# Patient Record
Sex: Female | Born: 2000 | Race: White | Hispanic: No | Marital: Single | Smoking: Never smoker
Health system: Southern US, Community
[De-identification: ages and names within clinical notes are randomized; demographics above are authoritative.]

## PROBLEM LIST (undated history)

## (undated) DIAGNOSIS — F32A Depression, unspecified: Secondary | ICD-10-CM

## (undated) DIAGNOSIS — F419 Anxiety disorder, unspecified: Secondary | ICD-10-CM

## (undated) DIAGNOSIS — R569 Unspecified convulsions: Secondary | ICD-10-CM

## (undated) DIAGNOSIS — G43909 Migraine, unspecified, not intractable, without status migrainosus: Secondary | ICD-10-CM

## (undated) HISTORY — DX: Migraine, unspecified, not intractable, without status migrainosus: G43.909

## (undated) HISTORY — PX: TYMPANOSTOMY TUBE PLACEMENT: SHX32

## (undated) HISTORY — PX: TONSILLECTOMY: SUR1361

---
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
## 2022-02-07 ENCOUNTER — Encounter: Payer: Self-pay | Admitting: Emergency Medicine

## 2022-02-07 ENCOUNTER — Ambulatory Visit
Admission: EM | Admit: 2022-02-07 | Discharge: 2022-02-07 | Disposition: A | Discharge status: Home / Self Care | Payer: BC Managed Care – PPO

## 2022-02-07 ENCOUNTER — Other Ambulatory Visit: Payer: Self-pay

## 2022-02-07 DIAGNOSIS — R519 Headache, unspecified: Secondary | ICD-10-CM | POA: Diagnosis not present

## 2022-02-07 MED ORDER — KETOROLAC TROMETHAMINE 30 MG/ML IJ SOLN
30.0000 mg | Freq: Once | INTRAMUSCULAR | Status: AC
Start: 2022-02-07 — End: 2022-02-07
  Administered 2022-02-07: 30 mg via INTRAMUSCULAR

## 2022-02-07 MED ORDER — ONDANSETRON 4 MG PO TBDP
4.0000 mg | ORAL_TABLET | Freq: Once | ORAL | Status: AC
  Administered 2022-02-07: 4 mg via ORAL

## 2022-02-07 MED ORDER — SUMATRIPTAN SUCCINATE 6 MG/0.5ML ~~LOC~~ SOLN: 6.0000 mg | Freq: Once | SUBCUTANEOUS | Status: DC

## 2022-02-07 MED ORDER — DEXAMETHASONE SODIUM PHOSPHATE 10 MG/ML IJ SOLN
10.0000 mg | Freq: Once | INTRAMUSCULAR | Status: AC
  Administered 2022-02-07: 10 mg via INTRAMUSCULAR

## 2022-02-07 NOTE — ED Provider Notes (Signed)
Casey Miller    CSN: [id number] Arrival date & time: 02/07/22  1249      History   Chief Complaint Chief Complaint  Patient presents with   Headache    HPI Casey Miller is a 21 y.o. female.   Patient presents with a persistent frontal headache nausea without vomiting and body aches for 7 days, endorses fever beginning today.  She had photophobia, phonophobia and floaters.  endorses that symptoms worsen over the last 2 days.  Decreased appetite but tolerating some food and liquids.  No known sick contacts.  Has attempted use of an over-the-counter analgesic which has been ineffective.  Denies ear pain, congestion, sore throat, coughing, shortness of breath or wheezing.  History of cluster headaches/migraines.   Past Medical History:  Diagnosis Date   Depression    Migraine     Patient Active Problem List   Diagnosis Date Noted   ALLERGIC RHINITIS 05/14/2010    Past Surgical History:  Procedure Laterality Date   TONSILLECTOMY     TYMPANOSTOMY TUBE PLACEMENT      OB History     Gravida  0   Para  0   Term  0   Preterm  0   AB  0   Living  0      SAB  0   IAB  0   Ectopic  0   Multiple  0   Live Births  0            Home Medications    Prior to Admission medications   Medication Sig Start Date End Date Taking? Authorizing Provider  acetaminophen (TYLENOL) 325 MG tablet Take 650 mg by mouth every 6 (six) hours as needed.   Yes [provider]  benzonatate (TESSALON) 100 MG capsule Take 2 capsules (200 mg total) by mouth every 8 (eight) hours. Patient not taking: Reported on 02/07/2022 04/12/21   Margarette Canada, NP  ipratropium (ATROVENT) 0.06 % nasal spray Place 2 sprays into both nostrils 4 (four) times daily. Patient not taking: Reported on 02/07/2022 04/12/21   Margarette Canada, NP  metroNIDAZOLE (FLAGYL) 500 MG tablet Take 1 tablet (500 mg total) by mouth 2 (two) times daily. Patient not taking: Reported on 02/07/2022 12/07/21    Chase Picket, MD  naproxen (NAPROSYN) 375 MG tablet Take 1 tablet (375 mg total) by mouth 2 (two) times daily as needed for moderate pain. Patient not taking: Reported on 02/07/2022 11/28/20   Coral Spikes, DO  promethazine-dextromethorphan (PROMETHAZINE-DM) 6.25-15 MG/5ML syrup Take 5 mLs by mouth 4 (four) times daily as needed. Patient not taking: Reported on 02/07/2022 04/12/21   Margarette Canada, NP  mirtazapine (REMERON) 7.5 MG tablet Take 7.5 mg by mouth at bedtime. Patient not taking: No sig reported 04/19/20 11/28/20  [provider]    Family History Family History  Problem Relation Age of Onset   Cancer Paternal Grandmother    Cancer Maternal Grandmother    Hypertension Father    Migraines Father    Cancer Father    Migraines Mother    Mental illness Sister     Social History Social History   Tobacco Use   Smoking status: Never    Passive exposure: Yes   Smokeless tobacco: Never   Tobacco comments:    mother smokes outside  Vaping Use   Vaping Use: Some days  Substance Use Topics   Alcohol use: No   Drug use: No  Allergies   Amoxicillin   Review of Systems Review of Systems Defer to HPI   Physical Exam Triage Vital Signs ED Triage Vitals  Enc Vitals Group     BP 02/07/22 1315 108/71     Pulse Rate 02/07/22 1315 (!) 104     Resp 02/07/22 1315 16     Temp 02/07/22 1315 99 F (37.2 C)     Temp Source 02/07/22 1315 Oral     SpO2 02/07/22 1315 97 %     Weight --      Height --      Head Circumference --      Peak Flow --      Pain Score 02/07/22 1312 7     Pain Loc --      Pain Edu? --      Excl. in [place]? --    No data found.  Updated Vital Signs BP 108/71 (BP Location: Left Arm)   Pulse (!) 104   Temp 99 F (37.2 C) (Oral)   Resp 16   SpO2 97%   Visual Acuity Right Eye Distance:   Left Eye Distance:   Bilateral Distance:    Right Eye Near:   Left Eye Near:    Bilateral Near:     Physical Exam Constitutional:       Appearance: Normal appearance. She is well-developed.  HENT:     Head: Normocephalic.  Eyes:     Extraocular Movements: Extraocular movements intact.  Pulmonary:     Effort: Pulmonary effort is normal.  Neurological:     General: No focal deficit present.     Mental Status: She is alert and oriented to person, place, and time. Mental status is at baseline.     Cranial Nerves: No cranial nerve deficit.     Sensory: No sensory deficit.     Motor: No weakness.     Coordination: Coordination normal.     Gait: Gait normal.  Psychiatric:        Mood and Affect: Mood normal.        Behavior: Behavior normal.      UC Treatments / Results  Labs (all labs ordered are listed, but only abnormal results are displayed) Labs Reviewed  COVID-19, FLU A+B NAA    EKG   Radiology No results found.  Procedures Procedures (including critical care time)  Medications Ordered in UC Medications  ketorolac (TORADOL) 30 MG/ML injection 30 mg (30 mg Intramuscular Given 02/07/22 1357)  ondansetron (ZOFRAN-ODT) disintegrating tablet 4 mg (4 mg Oral Given 02/07/22 1357)  dexamethasone (DECADRON) injection 10 mg (10 mg Intramuscular Given 02/07/22 1357)    Initial Impression / Assessment and Plan / UC Course  I have reviewed the triage vital signs and the nursing notes.  Pertinent labs & imaging results that were available during my care of the patient were reviewed by me and considered in my medical decision making (see chart for details).  Bad headache  Etiology of symptoms are possibly all related to persistent headache.  But as patient has had fever symptoms could be a part of a viral process, discussed with patient, COVID test is pending, per CDC has met quarantine guidelines and as a young healthy adult does not qualify for antiviral treatment, Toradol, Decadron injection given in office as well as ODT Zofran, recommended continued use of over-the-counter analgesics, low stimulation activities,  adequate rest and increase fluid intake for supportive measures, may follow-up with urgent care as needed if symptoms persist  or worsen, work note given Final Clinical Impressions(s) / UC Diagnoses   Final diagnoses:  Bad headache     Discharge Instructions      Today you are being treated for a bad headache however as she was having fever we will complete COVID testing presents, may be related to a viral process which should improve with time  You have been given an injection of Decadron and Toradol to help reduce the pain and pressure associated with headache and you have been given nausea medicine to help calm this as well  At home you may take Tylenol 500 to 1000 mg every 6 hours and/or ibuprofen 800 mg every 8 hours for management of headache  You may use Zofran every 8 hours as needed to help manage nausea  Increase your fluid intake until your appetite returns to maintain your hydration  Headache is present participate in low stimulation activities, avoiding bright lights and loud noises  May follow-up with this urgent care or your primary doctor as needed for reevaluation   ED Prescriptions   None    PDMP not reviewed this encounter.   Hans Eden, NP 02/07/22 1601

## 2022-02-07 NOTE — ED Triage Notes (Signed)
Started feeling bad one week ago.  Now has nausea, body aches, headache

## 2022-02-07 NOTE — Discharge Instructions (Signed)
Today you are being treated for a bad headache however as she was having fever we will complete COVID testing presents, may be related to a viral process which should improve with time  You have been given an injection of Decadron and Toradol to help reduce the pain and pressure associated with headache and you have been given nausea medicine to help calm this as well  At home you may take Tylenol 500 to 1000 mg every 6 hours and/or ibuprofen 800 mg every 8 hours for management of headache  You may use Zofran every 8 hours as needed to help manage nausea  Increase your fluid intake until your appetite returns to maintain your hydration  Headache is present participate in low stimulation activities, avoiding bright lights and loud noises  May follow-up with this urgent care or your primary doctor as needed for reevaluation

## 2022-02-08 LAB — COVID-19, FLU A+B NAA
Influenza A, NAA: NOT DETECTED
Influenza B, NAA: NOT DETECTED
SARS-CoV-2, NAA: NOT DETECTED

## 2022-07-19 ENCOUNTER — Encounter: Payer: Self-pay | Admitting: Emergency Medicine

## 2022-07-19 DIAGNOSIS — M2392 Unspecified internal derangement of left knee: Secondary | ICD-10-CM | POA: Diagnosis not present

## 2022-07-19 DIAGNOSIS — M25562 Pain in left knee: Secondary | ICD-10-CM | POA: Diagnosis present

## 2022-07-19 NOTE — ED Triage Notes (Signed)
Pt presents via POV with complaints of L knee pain that started tonight. Pt was using the leg press at the gym and felt a "pop" in her left knee. Rates the pain 10/10 - no deformities noted. Pt took 800mg  of motrin PTA without improvement in her pain.

## 2022-07-20 ENCOUNTER — Emergency Department: Payer: BC Managed Care – PPO

## 2022-07-20 ENCOUNTER — Emergency Department
Admission: EM | Admit: 2022-07-20 | Discharge: 2022-07-20 | Disposition: A | Discharge status: Home / Self Care | Payer: BC Managed Care – PPO | Attending: Emergency Medicine | Admitting: Emergency Medicine

## 2022-07-20 DIAGNOSIS — M25562 Pain in left knee: Secondary | ICD-10-CM

## 2022-07-20 DIAGNOSIS — M2392 Unspecified internal derangement of left knee: Secondary | ICD-10-CM

## 2022-07-20 MED ORDER — HYDROCODONE-ACETAMINOPHEN 5-325 MG PO TABS: 1.0000 | ORAL_TABLET | Freq: Four times a day (QID) | ORAL | 0 refills | Status: DC | PRN

## 2022-07-20 MED ORDER — HYDROCODONE-ACETAMINOPHEN 5-325 MG PO TABS
1.0000 | ORAL_TABLET | Freq: Once | ORAL | Status: AC
  Administered 2022-07-20: 1 via ORAL
  Filled 2022-07-20: qty 1

## 2022-07-20 MED ORDER — IBUPROFEN 600 MG PO TABS: 600.0000 mg | ORAL_TABLET | Freq: Three times a day (TID) | ORAL | 0 refills | Status: DC | PRN

## 2022-07-20 NOTE — ED Provider Notes (Signed)
[facility] Provider Note    Event Date/Time   First MD Initiated Contact with Patient 07/20/22 0208     (approximate)   History   Left knee pain   HPI  Casey Miller is a 22 y.o. female  who presents to the ED from home with a chief complaint of left knee pain.  Patient was using the leg press at the gym around 190 pounds when she felt a "pop" in her left knee.  Took ibuprofen prior to arrival.  Hurts to ambulate.  Voices no other complaints or injuries.       Past Medical History   Past Medical History:  Diagnosis Date   Depression    Migraine      Active Problem List   Patient Active Problem List   Diagnosis Date Noted   ALLERGIC RHINITIS 05/14/2010     Past Surgical History   Past Surgical History:  Procedure Laterality Date   TONSILLECTOMY     TYMPANOSTOMY TUBE PLACEMENT       Home Medications   Prior to Admission medications   Medication Sig Start Date End Date Taking? Authorizing Provider  HYDROcodone-acetaminophen (NORCO) 5-325 MG tablet Take 1 tablet by mouth every 6 (six) hours as needed for moderate pain. 07/20/22  Yes Paulette Blanch, MD  ibuprofen (ADVIL) 600 MG tablet Take 1 tablet (600 mg total) by mouth every 8 (eight) hours as needed. 07/20/22  Yes Paulette Blanch, MD  acetaminophen (TYLENOL) 325 MG tablet Take 650 mg by mouth every 6 (six) hours as needed.    [provider]  benzonatate (TESSALON) 100 MG capsule Take 2 capsules (200 mg total) by mouth every 8 (eight) hours. Patient not taking: Reported on 02/07/2022 04/12/21   Margarette Canada, NP  ipratropium (ATROVENT) 0.06 % nasal spray Place 2 sprays into both nostrils 4 (four) times daily. Patient not taking: Reported on 02/07/2022 04/12/21   Margarette Canada, NP  metroNIDAZOLE (FLAGYL) 500 MG tablet Take 1 tablet (500 mg total) by mouth 2 (two) times daily. Patient not taking: Reported on 02/07/2022 12/07/21   Chase Picket, MD  naproxen (NAPROSYN) 375 MG tablet Take  1 tablet (375 mg total) by mouth 2 (two) times daily as needed for moderate pain. Patient not taking: Reported on 02/07/2022 11/28/20   Coral Spikes, DO  promethazine-dextromethorphan (PROMETHAZINE-DM) 6.25-15 MG/5ML syrup Take 5 mLs by mouth 4 (four) times daily as needed. Patient not taking: Reported on 02/07/2022 04/12/21   Margarette Canada, NP  mirtazapine (REMERON) 7.5 MG tablet Take 7.5 mg by mouth at bedtime. Patient not taking: No sig reported 04/19/20 11/28/20  [provider]     Allergies  Amoxicillin   Family History   Family History  Problem Relation Age of Onset   Cancer Paternal Grandmother    Cancer Maternal Grandmother    Hypertension Father    Migraines Father    Cancer Father    Migraines Mother    Mental illness Sister      Physical Exam  Triage Vital Signs: ED Triage Vitals  Enc Vitals Group     BP 07/19/22 2353 120/88     Pulse Rate 07/19/22 2353 (!) 105     Resp 07/19/22 2353 18     Temp 07/19/22 2353 (!) 97.5 F (36.4 C)     Temp Source 07/19/22 2353 Oral     SpO2 07/19/22 2353 100 %     Weight 07/19/22 2353 120 lb (  54.4 kg)     Height 07/19/22 2353 5\' 7"  (1.702 m)     Head Circumference --      Peak Flow --      Pain Score 07/19/22 2355 10     Pain Loc --      Pain Edu? --      Excl. in [place]? --     Updated Vital Signs: BP 120/88 (BP Location: Left Arm)   Pulse (!) 105   Temp (!) 97.5 F (36.4 C) (Oral)   Resp 18   Ht 5\' 7"  (1.702 m)   Wt 54.4 kg   LMP 07/12/2022 (Approximate)   SpO2 100%   BMI 18.79 kg/m    General: Awake, mild distress.  CV:  Good peripheral perfusion.  Resp:  Normal effort.  Abd:  No distention.  Other:  Left knee: Mild uniform swelling.  Tender to palpation all aspects.  Limited range of motion secondary to pain.  Supple calf without tenderness.  2+ distal pulses.   ED Results / Procedures / Treatments  Labs (all labs ordered are listed, but only abnormal results are displayed) Labs Reviewed - No data  to display   EKG  None   RADIOLOGY I have independently visualized and interpreted patient's x-ray as well as noted the radiology interpretation:  Left knee xray: No acute osseous injury  Official radiology report(s): DG Knee Complete 4 Views Left  Result Date: 07/20/2022 CLINICAL DATA:  Left knee pain after feeling a pop EXAM: LEFT KNEE - COMPLETE 4+ VIEW COMPARISON:  None Available. FINDINGS: No evidence of fracture, dislocation, or joint effusion. No evidence of arthropathy or other focal bone abnormality. Soft tissues are unremarkable. IMPRESSION: Negative. Electronically Signed   By: Placido Sou M.D.   On: 07/20/2022 01:17     PROCEDURES:  Critical Care performed: No  Procedures   MEDICATIONS ORDERED IN ED: Medications  HYDROcodone-acetaminophen (NORCO/VICODIN) 5-325 MG per tablet 1 tablet (has no administration in time range)     IMPRESSION / MDM / ASSESSMENT AND PLAN / ED COURSE  I reviewed the triage vital signs and the nursing notes.                             22 year old female presenting with left knee pain/injury.  X-rays negative for acute osseous injury.  Patient is most comfortable in flexion so do not feel knee immobilizer would work for her.  Will place an Ace wrap, NSAIDs, analgesia and patient will follow-up with orthopedics outpatient.  Strict return precautions given.  Patient and her mother verbalized understanding and agree with plan of care.  Patient's presentation is most consistent with acute, uncomplicated illness.   FINAL CLINICAL IMPRESSION(S) / ED DIAGNOSES   Final diagnoses:  Acute pain of left knee  Derangement of left knee     Rx / DC Orders   ED Discharge Orders          Ordered    ibuprofen (ADVIL) 600 MG tablet  Every 8 hours PRN        07/20/22 0214    HYDROcodone-acetaminophen (NORCO) 5-325 MG tablet  Every 6 hours PRN        07/20/22 0214             Note:  This document was prepared using Dragon voice  recognition software and may include unintentional dictation errors.   Paulette Blanch, MD 07/20/22 [phone]

## 2022-07-20 NOTE — Discharge Instructions (Signed)
You may take pain medicines as needed (Motrin/Norco). Elevate affected area and apply ice several times daily to reduce swelling. Wear ace wrap and use crutches as needed. Return to the ER for worsening symptoms, increased swelling or other concerns.

## 2022-08-31 IMAGING — US US PELVIS COMPLETE WITH TRANSVAGINAL
1 series · 14 of 25 positions shown · non-contrast
Comparison: None

CLINICAL DATA: Right adnexal tenderness

EXAM:
TRANSABDOMINAL AND TRANSVAGINAL ULTRASOUND OF PELVIS
TECHNIQUE: Both transabdominal and transvaginal ultrasound examinations of the
pelvis were performed. Transabdominal technique was performed for
global imaging of the pelvis including uterus, ovaries, adnexal
regions, and pelvic cul-de-sac. It was necessary to proceed with
endovaginal exam following the transabdominal exam to visualize the
ovaries bilaterally.

[Series 1: us pelvic complete with transvaginal · 14 of 114 slices shown]
[im 1/114]
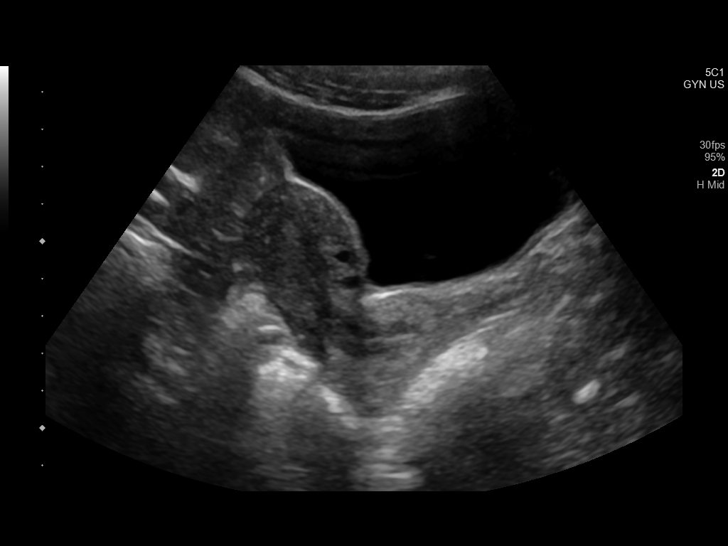
[im 10/114]
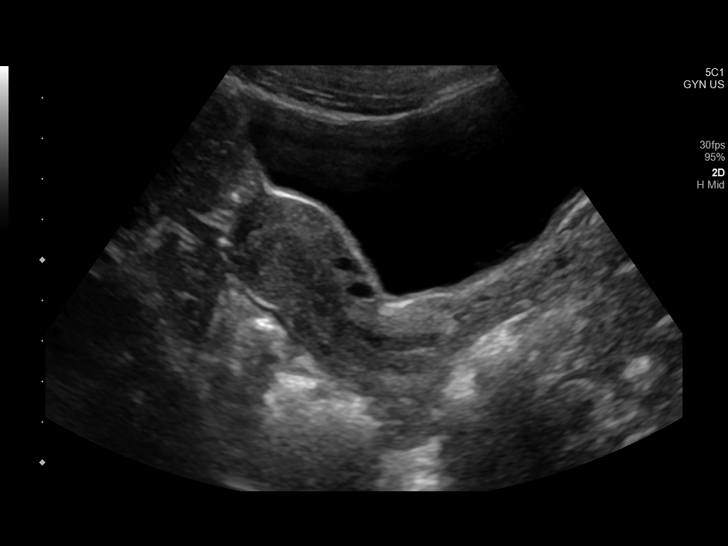
[im 19/114]
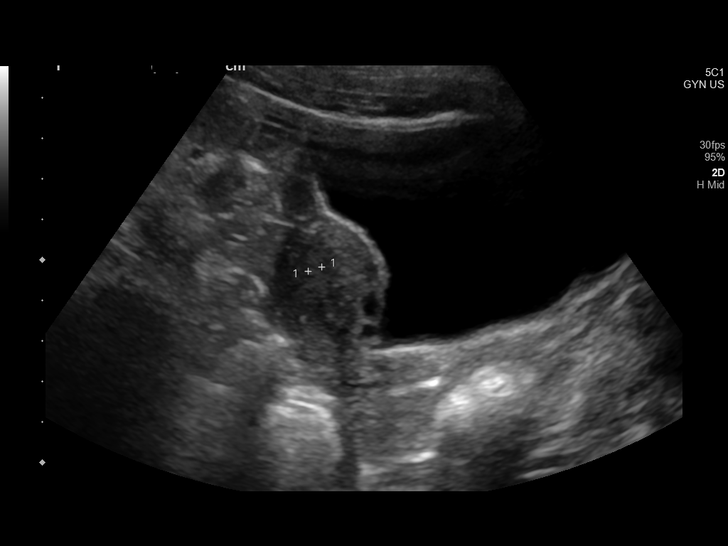
[im 29/114]
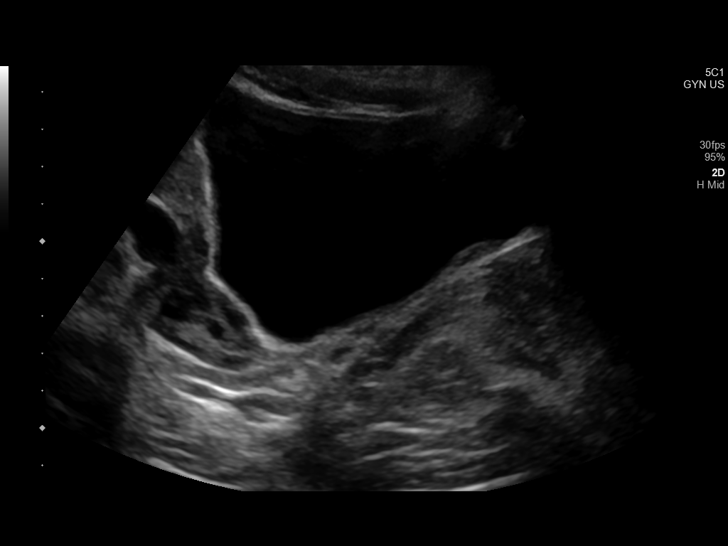
[im 38/114]
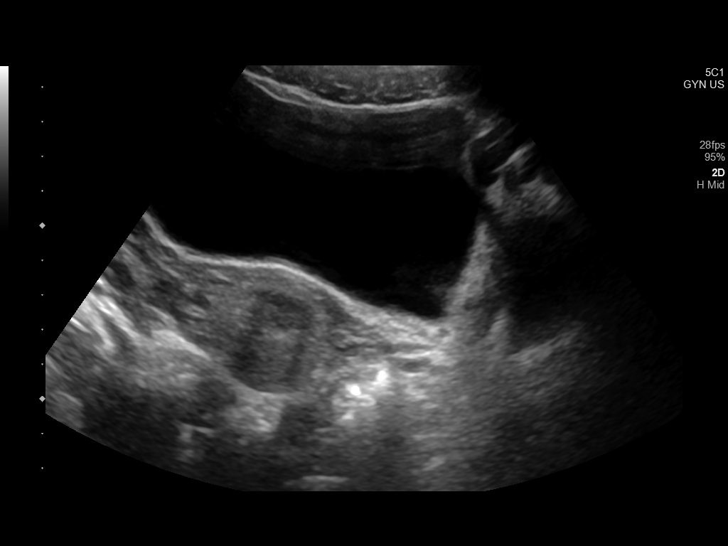
[im 43/114]
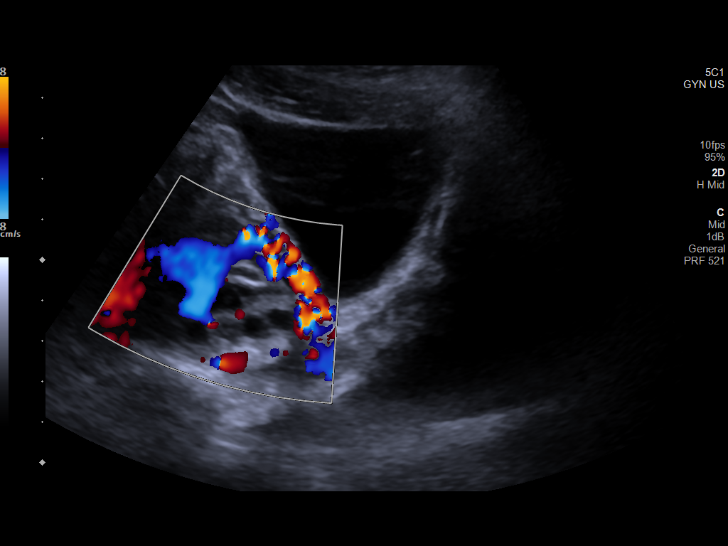
[im 52/114]
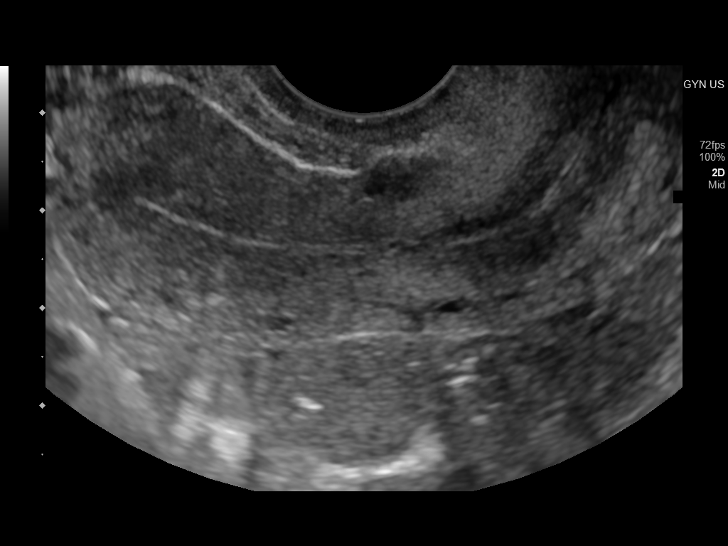
[im 62/114]
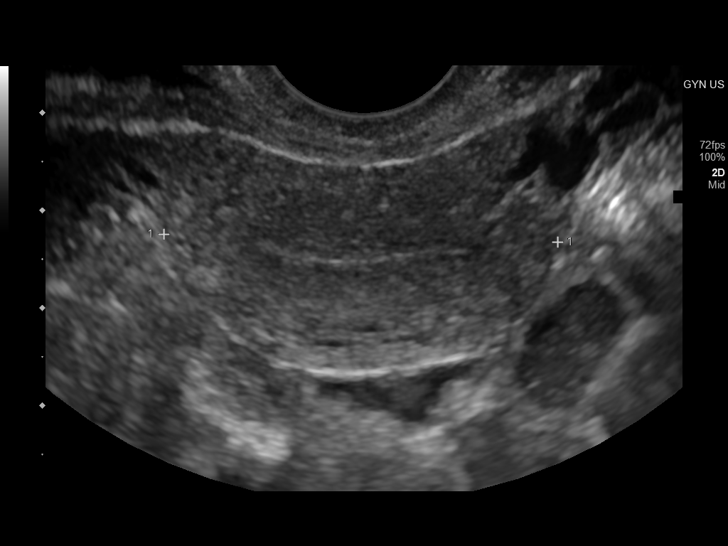
[im 71/114]
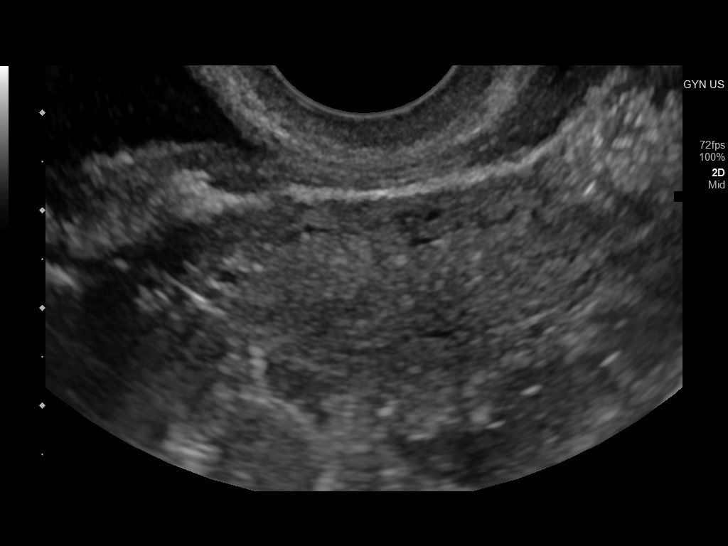
[im 76/114]
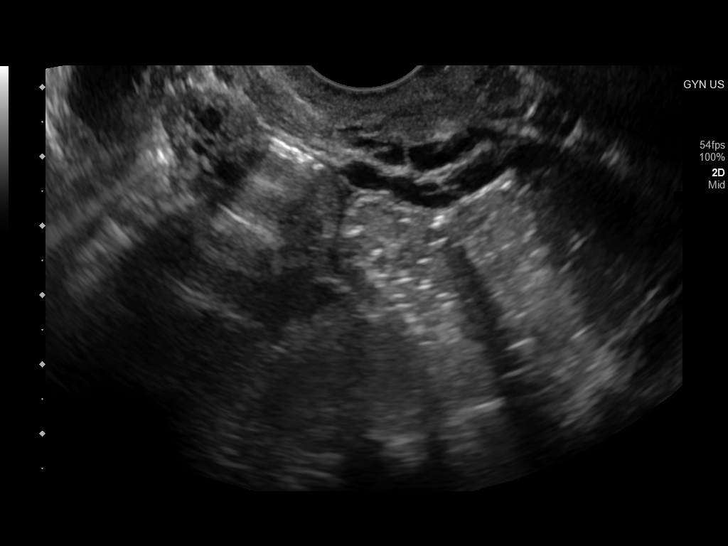
[im 85/114]
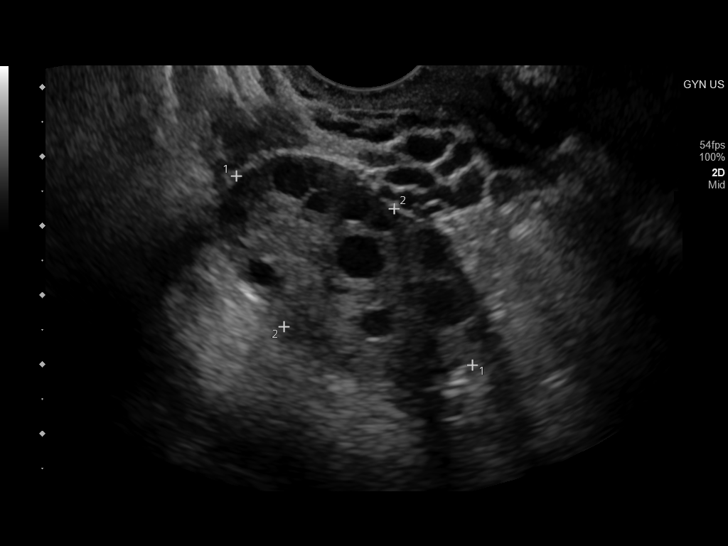
[im 95/114]
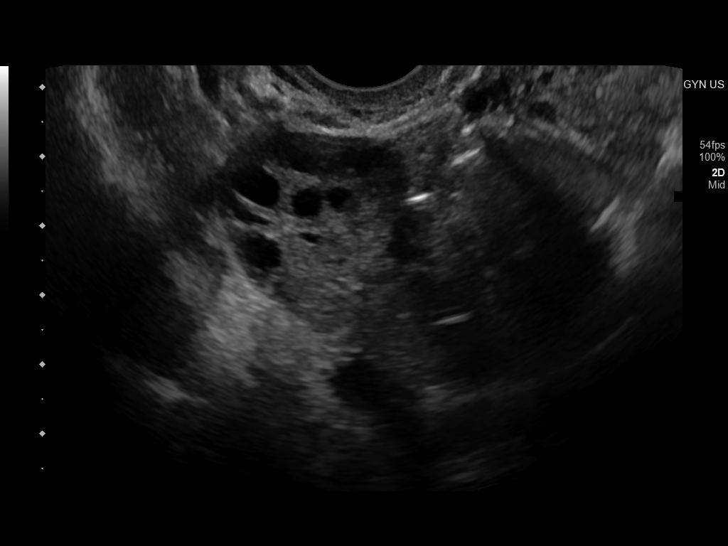
[im 104/114]
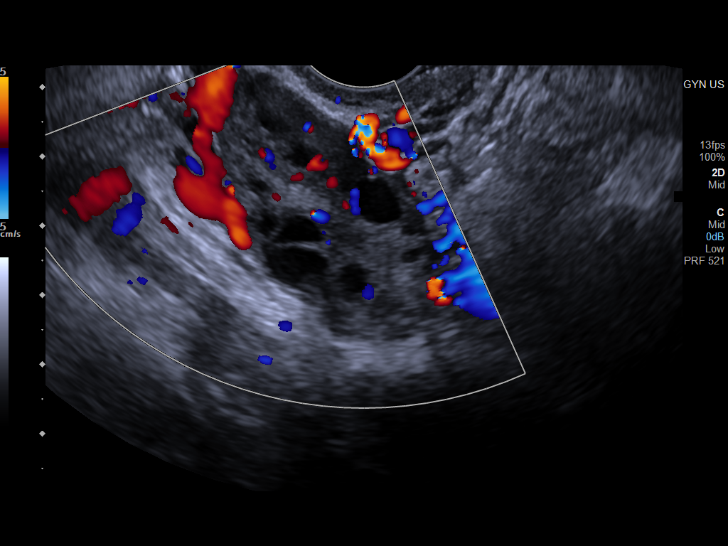
[im 114/114]
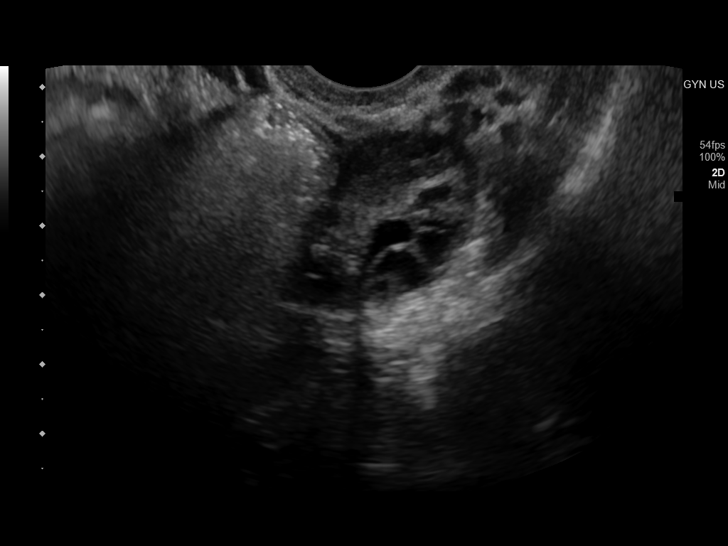

[14 of 25 positions shown; findings below may reference images not displayed]

FINDINGS: Uterus

Measurements: 5.8 x 2.4 x 4.0 cm = volume: 29 mL. No fibroids or
other mass visualized. The uterus is anteverted. Cervix is
unremarkable.

Endometrium

Thickness: 4 mm.  No focal abnormality visualized.

Right ovary

Measurements: 4.4 x 2.3 x 2.7 cm = volume: 14 mL. Normal
appearance/no adnexal mass.

Left ovary

Measurements: 4.6 x 2.2 x 2.3 cm = volume: 12 mL. Normal
appearance/no adnexal mass.

Other findings

No abnormal free fluid.
IMPRESSION: Normal pelvic sonogram

## 2022-09-25 ENCOUNTER — Emergency Department
Admission: EM | Admit: 2022-09-25 | Discharge: 2022-09-25 | Disposition: A | Discharge status: Home / Self Care | Payer: BC Managed Care – PPO | Attending: Emergency Medicine | Admitting: Emergency Medicine

## 2022-09-25 DIAGNOSIS — K529 Noninfective gastroenteritis and colitis, unspecified: Secondary | ICD-10-CM

## 2022-09-25 DIAGNOSIS — Z1152 Encounter for screening for COVID-19: Secondary | ICD-10-CM | POA: Insufficient documentation

## 2022-09-25 DIAGNOSIS — R197 Diarrhea, unspecified: Secondary | ICD-10-CM | POA: Diagnosis not present

## 2022-09-25 DIAGNOSIS — R112 Nausea with vomiting, unspecified: Secondary | ICD-10-CM | POA: Insufficient documentation

## 2022-09-25 DIAGNOSIS — R109 Unspecified abdominal pain: Secondary | ICD-10-CM | POA: Insufficient documentation

## 2022-09-25 LAB — URINALYSIS, ROUTINE W REFLEX MICROSCOPIC
Bilirubin Urine: NEGATIVE
Glucose, UA: NEGATIVE mg/dL
Hgb urine dipstick: NEGATIVE
Ketones, ur: NEGATIVE mg/dL
Leukocytes,Ua: NEGATIVE
Nitrite: NEGATIVE
Protein, ur: NEGATIVE mg/dL
Specific Gravity, Urine: 1.011 (ref 1.005–1.030)
pH: 7 (ref 5.0–8.0)

## 2022-09-25 LAB — COMPREHENSIVE METABOLIC PANEL
ALT: 8 U/L (ref 0–44)
AST: 18 U/L (ref 15–41)
Albumin: 4.6 g/dL (ref 3.5–5.0)
Alkaline Phosphatase: 49 U/L (ref 38–126)
Anion gap: 10 (ref 5–15)
BUN: 8 mg/dL (ref 6–20)
CO2: 24 mmol/L (ref 22–32)
Calcium: 9.4 mg/dL (ref 8.9–10.3)
Chloride: 107 mmol/L (ref 98–111)
Creatinine, Ser: 0.77 mg/dL (ref 0.44–1.00)
GFR, Estimated: >60 mL/min (ref >60)
Glucose, Bld: 101 mg/dL — ABNORMAL HIGH (ref 70–99)
Potassium: 3.3 mmol/L — ABNORMAL LOW (ref 3.5–5.1)
Sodium: 141 mmol/L (ref 135–145)
Total Bilirubin: 0.9 mg/dL (ref 0.3–1.2)
Total Protein: 7.4 g/dL (ref 6.5–8.1)

## 2022-09-25 LAB — CBC
HCT: 40.4 % (ref 36.0–46.0)
Hemoglobin: 13.5 g/dL (ref 12.0–15.0)
MCH: 29.6 pg (ref 26.0–34.0)
MCHC: 33.4 g/dL (ref 30.0–36.0)
MCV: 88.6 fL (ref 80.0–100.0)
Platelets: 255 10*3/uL (ref 150–400)
RBC: 4.56 MIL/uL (ref 3.87–5.11)
RDW: 12.4 % (ref 11.5–15.5)
WBC: 5.9 10*3/uL (ref 4.0–10.5)
nRBC: 0 % (ref 0.0–0.2)

## 2022-09-25 LAB — RESP PANEL BY RT-PCR (RSV, FLU A&B, COVID)  RVPGX2
Influenza A by PCR: NEGATIVE
Influenza B by PCR: NEGATIVE
Resp Syncytial Virus by PCR: NEGATIVE
SARS Coronavirus 2 by RT PCR: NEGATIVE

## 2022-09-25 LAB — POC URINE PREG, ED: Preg Test, Ur: NEGATIVE

## 2022-09-25 LAB — LIPASE, BLOOD: Lipase: 33 U/L (ref 11–51)

## 2022-09-25 MED ORDER — KETOROLAC TROMETHAMINE 30 MG/ML IJ SOLN
15.0000 mg | Freq: Once | INTRAMUSCULAR | Status: AC
  Administered 2022-09-25: 15 mg via INTRAVENOUS
  Filled 2022-09-25: qty 1

## 2022-09-25 MED ORDER — ONDANSETRON HCL 4 MG/2ML IJ SOLN
4.0000 mg | Freq: Once | INTRAMUSCULAR | Status: AC
  Administered 2022-09-25: 4 mg via INTRAVENOUS
  Filled 2022-09-25: qty 2

## 2022-09-25 MED ORDER — SODIUM CHLORIDE 0.9 % IV BOLUS
1000.0000 mL | Freq: Once | INTRAVENOUS | Status: AC
  Administered 2022-09-25: 1000 mL via INTRAVENOUS

## 2022-09-25 MED ORDER — ONDANSETRON 4 MG PO TBDP: 4.0000 mg | ORAL_TABLET | Freq: Three times a day (TID) | ORAL | 0 refills | Status: DC | PRN

## 2022-09-25 NOTE — ED Triage Notes (Signed)
Pt co of vomiting / diarrhea since last night. Pt co of lower midline abd pain. Pt denies pain/burning with urination.

## 2022-09-25 NOTE — ED Provider Notes (Signed)
   [facility] Provider Note    Event Date/Time   First MD Initiated Contact with Patient 09/25/22 1531     (approximate)  History   Chief Complaint: Abdominal Pain  HPI  Casey Miller is a 22 y.o. female with a past medical history of depression, migraines, presents emergency department for nausea vomiting and diarrhea.  According to the patient for the past 2 days she has had significant nausea vomiting diarrhea.  States she has not been able to keep down any fluids today so she came to the emergency department for evaluation.  Patient denies any fever.  States mild vague abdominal cramping but denies any focal abdominal pain.  No known sick contacts.  Physical Exam   Triage Vital Signs: ED Triage Vitals  Enc Vitals Group     BP 09/25/22 1501 114/80     Pulse Rate 09/25/22 1501 (!) 108     Resp 09/25/22 1501 16     Temp 09/25/22 1501 98.3 F (36.8 C)     Temp Source 09/25/22 1501 Oral     SpO2 09/25/22 1501 100 %     Weight 09/25/22 1500 118 lb (53.5 kg)     Height 09/25/22 1500 5\' 6"  (1.676 m)     Head Circumference --      Peak Flow --      Pain Score 09/25/22 1509 8     Pain Loc --      Pain Edu? --      Excl. in [place]? --     Most recent vital signs: Vitals:   09/25/22 1501  BP: 114/80  Pulse: (!) 108  Resp: 16  Temp: 98.3 F (36.8 C)  SpO2: 100%    General: Awake, no distress.  CV:  Good peripheral perfusion.  Regular rate and rhythm  Resp:  Normal effort.  Equal breath sounds bilaterally.  Abd:  No distention.  Soft, slight diffuse tenderness without focal tenderness identified.  ED Results / Procedures / Treatments    MEDICATIONS ORDERED IN ED: Medications  sodium chloride 0.9 % bolus 1,000 mL (has no administration in time range)  ondansetron (ZOFRAN) injection 4 mg (has no administration in time range)  ketorolac (TORADOL) 30 MG/ML injection 15 mg (has no administration in time range)     IMPRESSION / MDM / ASSESSMENT  AND PLAN / ED COURSE  I reviewed the triage vital signs and the nursing notes.  Patient's presentation is most consistent with acute presentation with potential threat to life or bodily function.  Patient presents emergency department for nausea vomiting and diarrhea.  There is a slight diffuse tenderness of the abdomen.  No focal tenderness identified, special attention paid to right lower quadrant.  Denies any urinary symptoms.  No vaginal bleeding or discharge.  Suspect gastroenteritis.  We will check labs, IV hydrate treat nausea and continue to closely monitor while awaiting lab results.  Patient's this has resulted normal, pregnancy test is negative.  Patient's COVID/flu/RSV is negative.  CBC reassuring with normal white blood cell count, chemistry reassuring with normal renal function.  Will discharge the patient with Zofran and supportive care.  Patient agreeable to plan and is feeling much better after fluids.  FINAL CLINICAL IMPRESSION(S) / ED DIAGNOSES   Nausea vomiting diarrhea  Rx / DC Orders   Zofran  Note:  This document was prepared using Dragon voice recognition software and may include unintentional dictation errors.   Harvest Dark, MD 09/25/22 Drema Halon

## 2022-12-14 ENCOUNTER — Ambulatory Visit
Admission: EM | Admit: 2022-12-14 | Discharge: 2022-12-14 | Disposition: A | Discharge status: Home / Self Care | Payer: BC Managed Care – PPO | Attending: Emergency Medicine | Admitting: Emergency Medicine

## 2022-12-14 DIAGNOSIS — J209 Acute bronchitis, unspecified: Secondary | ICD-10-CM | POA: Diagnosis not present

## 2022-12-14 DIAGNOSIS — J302 Other seasonal allergic rhinitis: Secondary | ICD-10-CM

## 2022-12-14 MED ORDER — PREDNISONE 10 MG (21) PO TBPK: ORAL_TABLET | Freq: Every day | ORAL | 0 refills | Status: DC

## 2022-12-14 MED ORDER — ALBUTEROL SULFATE HFA 108 (90 BASE) MCG/ACT IN AERS: 1.0000 | INHALATION_SPRAY | Freq: Four times a day (QID) | RESPIRATORY_TRACT | 0 refills | Status: AC | PRN

## 2022-12-14 NOTE — ED Triage Notes (Signed)
Patient to Urgent Care with complaints of cough. Symptoms started two weeks ago. Reports over the last 4-5 days she started developing nasal congestion/ loss of taste and smell. Cough has become productive with discolored mucus.  Denies any known fevers. Reports waking up one night sweating.  Taking otc allergy meds.

## 2022-12-14 NOTE — ED Provider Notes (Signed)
Casey Miller    CSN: [id number] Arrival date & time: 12/14/22  1051      History   Chief Complaint Chief Complaint  Patient presents with   Nasal Congestion   Cough    HPI Casey Miller is a 22 y.o. female.  Patient presents with cough x 2 weeks.  She has nasal congestion, postnasal drip, runny nose x 4 days.  Her chest feels tight when she coughs.  No fever, rash, shortness of breath, or other symptoms.  Treatment attempted with Benadryl.  Medical history includes seasonal allergies.  No history of asthma.  LMP 2 weeks ago.   The history is provided by the patient and medical records.    Past Medical History:  Diagnosis Date   Depression    Migraine     Patient Active Problem List   Diagnosis Date Noted   ALLERGIC RHINITIS 05/14/2010    Past Surgical History:  Procedure Laterality Date   TONSILLECTOMY     TYMPANOSTOMY TUBE PLACEMENT      OB History     Gravida  0   Para  0   Term  0   Preterm  0   AB  0   Living  0      SAB  0   IAB  0   Ectopic  0   Multiple  0   Live Births  0            Home Medications    Prior to Admission medications   Medication Sig Start Date End Date Taking? Authorizing Provider  albuterol (VENTOLIN HFA) 108 (90 Base) MCG/ACT inhaler Inhale 1-2 puffs into the lungs every 6 (six) hours as needed. 12/14/22  Yes Mickie Bail, NP  predniSONE (STERAPRED UNI-PAK 21 TAB) 10 MG (21) TBPK tablet Take by mouth daily. As directed 12/14/22  Yes Mickie Bail, NP  acetaminophen (TYLENOL) 325 MG tablet Take 650 mg by mouth every 6 (six) hours as needed.    [provider]  benzonatate (TESSALON) 100 MG capsule Take 2 capsules (200 mg total) by mouth every 8 (eight) hours. Patient not taking: Reported on 02/07/2022 04/12/21   Becky Augusta, NP  HYDROcodone-acetaminophen (NORCO) 5-325 MG tablet Take 1 tablet by mouth every 6 (six) hours as needed for moderate pain. Patient not taking: Reported on 12/14/2022 07/20/22    Irean Hong, MD  ibuprofen (ADVIL) 600 MG tablet Take 1 tablet (600 mg total) by mouth every 8 (eight) hours as needed. Patient not taking: Reported on 12/14/2022 07/20/22   Irean Hong, MD  ipratropium (ATROVENT) 0.06 % nasal spray Place 2 sprays into both nostrils 4 (four) times daily. Patient not taking: Reported on 02/07/2022 04/12/21   Becky Augusta, NP  metroNIDAZOLE (FLAGYL) 500 MG tablet Take 1 tablet (500 mg total) by mouth 2 (two) times daily. Patient not taking: Reported on 02/07/2022 12/07/21   Merrilee Jansky, MD  naproxen (NAPROSYN) 375 MG tablet Take 1 tablet (375 mg total) by mouth 2 (two) times daily as needed for moderate pain. Patient not taking: Reported on 02/07/2022 11/28/20   Tommie Sams, DO  ondansetron (ZOFRAN-ODT) 4 MG disintegrating tablet Take 1 tablet (4 mg total) by mouth every 8 (eight) hours as needed for nausea or vomiting. 09/25/22   Minna Antis, MD  promethazine-dextromethorphan (PROMETHAZINE-DM) 6.25-15 MG/5ML syrup Take 5 mLs by mouth 4 (four) times daily as needed. Patient not taking: Reported on 02/07/2022 04/12/21   Alycia Rossetti,  Riki Rusk, NP  mirtazapine (REMERON) 7.5 MG tablet Take 7.5 mg by mouth at bedtime. Patient not taking: No sig reported 04/19/20 11/28/20  [provider]    Family History Family History  Problem Relation Age of Onset   Cancer Paternal Grandmother    Cancer Maternal Grandmother    Hypertension Father    Migraines Father    Cancer Father    Migraines Mother    Mental illness Sister     Social History Social History   Tobacco Use   Smoking status: Never    Passive exposure: Yes   Smokeless tobacco: Never   Tobacco comments:    mother smokes outside  Vaping Use   Vaping Use: Some days  Substance Use Topics   Alcohol use: No   Drug use: No     Allergies   Amoxicillin   Review of Systems Review of Systems  Constitutional:  Negative for chills and fever.  HENT:  Positive for congestion, postnasal drip and  rhinorrhea. Negative for ear pain and sore throat.   Respiratory:  Positive for cough and chest tightness. Negative for shortness of breath.   Gastrointestinal:  Negative for diarrhea and vomiting.  Skin:  Negative for rash.     Physical Exam Triage Vital Signs ED Triage Vitals  Enc Vitals Group     BP 12/14/22 1128 98/72     Pulse Rate 12/14/22 1125 (!) 108     Resp 12/14/22 1125 18     Temp 12/14/22 1125 98.1 F (36.7 C)     Temp src --      SpO2 12/14/22 1125 96 %     Weight --      Height --      Head Circumference --      Peak Flow --      Pain Score 12/14/22 1126 6     Pain Loc --      Pain Edu? --      Excl. in [place]? --    No data found.  Updated Vital Signs BP 98/72   Pulse (!) 108   Temp 98.1 F (36.7 C)   Resp 18   SpO2 96%   Visual Acuity Right Eye Distance:   Left Eye Distance:   Bilateral Distance:    Right Eye Near:   Left Eye Near:    Bilateral Near:     Physical Exam Vitals and nursing note reviewed.  Constitutional:      General: She is not in acute distress.    Appearance: Normal appearance. She is well-developed. She is not ill-appearing.  HENT:     Right Ear: Tympanic membrane normal.     Left Ear: Tympanic membrane normal.     Nose: Congestion present.     Mouth/Throat:     Mouth: Mucous membranes are moist.     Pharynx: Oropharynx is clear.     Comments: Clear PND. Cardiovascular:     Rate and Rhythm: Normal rate and regular rhythm.     Heart sounds: Normal heart sounds.  Pulmonary:     Effort: Pulmonary effort is normal. No respiratory distress.     Breath sounds: Normal breath sounds. No wheezing.  Musculoskeletal:     Cervical back: Neck supple.  Skin:    General: Skin is warm and dry.  Neurological:     Mental Status: She is alert.  Psychiatric:        Mood and Affect: Mood normal.  Behavior: Behavior normal.      UC Treatments / Results  Labs (all labs ordered are listed, but only abnormal results are  displayed) Labs Reviewed - No data to display  EKG   Radiology No results found.  Procedures Procedures (including critical care time)  Medications Ordered in UC Medications - No data to display  Initial Impression / Assessment and Plan / UC Course  I have reviewed the triage vital signs and the nursing notes.  Pertinent labs & imaging results that were available during my care of the patient were reviewed by me and considered in my medical decision making (see chart for details).   Acute bronchitis, Seasonal allergies.  Treating with albuterol inhaler and prednisone.  Discussed daily allergy treatment such as Zyrtec and Flonase.  Instructed patient to follow up with her PCP if her symptoms are not improving.  She agrees to plan of care.     Final Clinical Impressions(s) / UC Diagnoses   Final diagnoses:  Acute bronchitis, unspecified organism  Seasonal allergies     Discharge Instructions      Use the albuterol inhaler and take the prednisone as directed.    Follow up with your primary care provider if your symptoms are not improving.        ED Prescriptions     Medication Sig Dispense Auth. Provider   albuterol (VENTOLIN HFA) 108 (90 Base) MCG/ACT inhaler Inhale 1-2 puffs into the lungs every 6 (six) hours as needed. 18 g Mickie Bail, NP   predniSONE (STERAPRED UNI-PAK 21 TAB) 10 MG (21) TBPK tablet Take by mouth daily. As directed 21 tablet Mickie Bail, NP      PDMP not reviewed this encounter.   Mickie Bail, NP 12/14/22 1157

## 2022-12-14 NOTE — Discharge Instructions (Addendum)
Use the albuterol inhaler and take the prednisone as directed.  Follow up with your primary care provider if your symptoms are not improving.    

## 2022-12-29 ENCOUNTER — Emergency Department: Payer: BC Managed Care – PPO

## 2022-12-29 ENCOUNTER — Other Ambulatory Visit: Payer: Self-pay

## 2022-12-29 ENCOUNTER — Emergency Department
Admission: EM | Admit: 2022-12-29 | Discharge: 2022-12-29 | Disposition: A | Discharge status: Home / Self Care | Payer: BC Managed Care – PPO | Attending: Student in an Organized Health Care Education/Training Program | Admitting: Student in an Organized Health Care Education/Training Program

## 2022-12-29 DIAGNOSIS — J209 Acute bronchitis, unspecified: Secondary | ICD-10-CM | POA: Diagnosis not present

## 2022-12-29 DIAGNOSIS — R059 Cough, unspecified: Secondary | ICD-10-CM | POA: Diagnosis present

## 2022-12-29 DIAGNOSIS — J40 Bronchitis, not specified as acute or chronic: Secondary | ICD-10-CM

## 2022-12-29 LAB — CBC
HCT: 37.7 % (ref 36.0–46.0)
Hemoglobin: 12.1 g/dL (ref 12.0–15.0)
MCH: 29.1 pg (ref 26.0–34.0)
MCHC: 32.1 g/dL (ref 30.0–36.0)
MCV: 90.6 fL (ref 80.0–100.0)
Platelets: 344 10*3/uL (ref 150–400)
RBC: 4.16 MIL/uL (ref 3.87–5.11)
RDW: 12 % (ref 11.5–15.5)
WBC: 9.3 10*3/uL (ref 4.0–10.5)
nRBC: 0 % (ref 0.0–0.2)

## 2022-12-29 LAB — BASIC METABOLIC PANEL
Anion gap: 8 (ref 5–15)
BUN: <5 mg/dL — ABNORMAL LOW (ref 6–20)
CO2: 25 mmol/L (ref 22–32)
Calcium: 8.8 mg/dL — ABNORMAL LOW (ref 8.9–10.3)
Chloride: 105 mmol/L (ref 98–111)
Creatinine, Ser: 0.62 mg/dL (ref 0.44–1.00)
GFR, Estimated: >60 mL/min (ref >60)
Glucose, Bld: 100 mg/dL — ABNORMAL HIGH (ref 70–99)
Potassium: 3.6 mmol/L (ref 3.5–5.1)
Sodium: 138 mmol/L (ref 135–145)

## 2022-12-29 LAB — POC URINE PREG, ED: Preg Test, Ur: NEGATIVE

## 2022-12-29 LAB — D-DIMER, QUANTITATIVE: D-Dimer, Quant: <0.27 ug/mL-FEU (ref 0.00–0.50)

## 2022-12-29 LAB — TROPONIN I (HIGH SENSITIVITY): Troponin I (High Sensitivity): <2 ng/L (ref <18)

## 2022-12-29 MED ORDER — ALBUTEROL SULFATE HFA 108 (90 BASE) MCG/ACT IN AERS: 2.0000 | INHALATION_SPRAY | Freq: Four times a day (QID) | RESPIRATORY_TRACT | 2 refills | Status: AC | PRN

## 2022-12-29 MED ORDER — DEXAMETHASONE 10 MG/ML FOR PEDIATRIC ORAL USE
10.0000 mg | Freq: Once | INTRAMUSCULAR | Status: AC
  Administered 2022-12-29: 10 mg via ORAL
  Filled 2022-12-29: qty 1

## 2022-12-29 MED ORDER — AZITHROMYCIN 250 MG PO TABS: ORAL_TABLET | ORAL | 0 refills | Status: AC

## 2022-12-29 NOTE — ED Provider Notes (Signed)
[facility] Provider Note    Event Date/Time   First MD Initiated Contact with Patient 12/29/22 1255     (approximate)   History   Chest Pain   HPI  DEMAYA Miller is a 22 y.o. female who presents to the ER for evaluation of chest pain cough and coughing up several sets of phlegm with blood in it.  No fevers.  Recently treated for bronchitis with prednisone taper and albuterol.  She does vape.  Has not felt much improvement steroids.     Physical Exam   Triage Vital Signs: ED Triage Vitals  Enc Vitals Group     BP 12/29/22 1231 114/78     Pulse Rate 12/29/22 1231 86     Resp 12/29/22 1231 19     Temp 12/29/22 1231 98.3 F (36.8 C)     Temp Source 12/29/22 1231 Oral     SpO2 12/29/22 1231 100 %     Weight 12/29/22 1228 130 lb (59 kg)     Height 12/29/22 1228 5\' 6"  (1.676 m)     Head Circumference --      Peak Flow --      Pain Score 12/29/22 1225 7     Pain Loc --      Pain Edu? --      Excl. in [place]? --     Most recent vital signs: Vitals:   12/29/22 1231  BP: 114/78  Pulse: 86  Resp: 19  Temp: 98.3 F (36.8 C)  SpO2: 100%     Constitutional: Alert  Eyes: Conjunctivae are normal.  Head: Atraumatic. Nose: No congestion/rhinnorhea. Mouth/Throat: Mucous membranes are moist.   Neck: Painless ROM.  Cardiovascular:   Good peripheral circulation. Respiratory: Normal respiratory effort.  No retractions.  Gastrointestinal: Soft and nontender.  Musculoskeletal:  no deformity Neurologic:  MAE spontaneously. No gross focal neurologic deficits are appreciated.  Skin:  Skin is warm, dry and intact. No rash noted. Psychiatric: Mood and affect are normal. Speech and behavior are normal.    ED Results / Procedures / Treatments   Labs (all labs ordered are listed, but only abnormal results are displayed) Labs Reviewed  BASIC METABOLIC PANEL - Abnormal; Notable for the following components:      Result Value   Glucose, Bld 100 (*)     BUN <5 (*)    Calcium 8.8 (*)    All other components within normal limits  CBC  D-DIMER, QUANTITATIVE  POC URINE PREG, ED  TROPONIN I (HIGH SENSITIVITY)     EKG  ED ECG REPORT I, Willy Eddy, the attending physician, personally viewed and interpreted this ECG.   Date: 12/29/2022  EKG Time: 12:24  Rate: 85  Rhythm: sinus  Axis: rt  Intervals: normal  ST&T Change: no stemi    RADIOLOGY Please see ED Course for my review and interpretation.  I personally reviewed all radiographic images ordered to evaluate for the above acute complaints and reviewed radiology reports and findings.  These findings were personally discussed with the patient.  Please see medical record for radiology report.    PROCEDURES:  Critical Care performed:   Procedures   MEDICATIONS ORDERED IN ED: Medications  dexamethasone (DECADRON) 10 MG/ML injection for Pediatric ORAL use 10 mg (has no administration in time range)     IMPRESSION / MDM / ASSESSMENT AND PLAN / ED COURSE  I reviewed the triage vital signs and the nursing notes.  Differential diagnosis includes, but is not limited to, Asthma, copd, CHF, pna, ptx, malignancy, Pe, anemia  Patient presenting to the ER for evaluation of symptoms as described above.  Based on symptoms, risk factors and considered above differential, this presenting complaint could reflect a potentially life-threatening illness therefore the patient will be placed on continuous pulse oximetry and telemetry for monitoring.  Laboratory evaluation will be sent to evaluate for the above complaints.      Clinical Course as of 12/29/22 1333  Fri Dec 29, 2022  1259 Chest x-ray without evidence of consolidation or pneumothorax. [PR]  1332 Patient is well-appearing no white count no anemia, troponin negative.  D-dimer is negative.  This not consistent with PE.  Chest x-ray on my review and interpretation without evidence of  consolidation but given her persistent cough will give azithromycin more for the anti-inflammatory effect as she is already been on steroids.  We discussed the importance of her avoiding vaping.  She does appear stable and appropriate for outpatient follow-up. [PR]    Clinical Course User Index [PR] Willy Eddy, MD     FINAL CLINICAL IMPRESSION(S) / ED DIAGNOSES   Final diagnoses:  Bronchitis     Rx / DC Orders   ED Discharge Orders          Ordered    azithromycin (ZITHROMAX Z-PAK) 250 MG tablet        12/29/22 1330    albuterol (VENTOLIN HFA) 108 (90 Base) MCG/ACT inhaler  Every 6 hours PRN        12/29/22 1330             Note:  This document was prepared using Dragon voice recognition software and may include unintentional dictation errors.    Willy Eddy, MD 12/29/22 [phone]

## 2022-12-29 NOTE — ED Triage Notes (Addendum)
Pt was dx with bronchitis x2 weeks, c/o CP and coughing up blood clots since yesterday. Pt AOX4, NAD noted. Lung sounds clear bilaterally. Pt has been taking PO steroids. Pt on Depo shot, no blood thinners.

## 2023-03-10 ENCOUNTER — Emergency Department: Payer: BC Managed Care – PPO

## 2023-03-10 ENCOUNTER — Other Ambulatory Visit: Payer: Self-pay

## 2023-03-10 ENCOUNTER — Emergency Department
Admission: EM | Admit: 2023-03-10 | Discharge: 2023-03-10 | Disposition: A | Discharge status: Home / Self Care | Payer: BC Managed Care – PPO | Attending: Emergency Medicine | Admitting: Emergency Medicine

## 2023-03-10 DIAGNOSIS — M79602 Pain in left arm: Secondary | ICD-10-CM | POA: Insufficient documentation

## 2023-03-10 MED ORDER — IBUPROFEN 800 MG PO TABS
800.0000 mg | ORAL_TABLET | Freq: Once | ORAL | Status: AC
  Administered 2023-03-10: 800 mg via ORAL
  Filled 2023-03-10: qty 1

## 2023-03-10 NOTE — ED Provider Notes (Addendum)
[facility] Provider Note    Event Date/Time   First MD Initiated Contact with Patient 03/10/23 [phone]     (approximate)   History   Arm Injury   HPI  Casey Miller is a 22 y.o. female right-hand-dominant with history of depression, migraines who presents to the emergency department with left elbow injury.  States she was scooting off the bed when her left arm went behind her and she felt a pop in the elbow.  Having pain in the shoulder and elbow.  No other injury.   History provided by patient, mother.    Past Medical History:  Diagnosis Date   Depression    Migraine     Past Surgical History:  Procedure Laterality Date   TONSILLECTOMY     TYMPANOSTOMY TUBE PLACEMENT      MEDICATIONS:  Prior to Admission medications   Medication Sig Start Date End Date Taking? Authorizing Provider  acetaminophen (TYLENOL) 325 MG tablet Take 650 mg by mouth every 6 (six) hours as needed.    [provider]  albuterol (VENTOLIN HFA) 108 (90 Base) MCG/ACT inhaler Inhale 1-2 puffs into the lungs every 6 (six) hours as needed. 12/14/22   Mickie Bail, NP  albuterol (VENTOLIN HFA) 108 (90 Base) MCG/ACT inhaler Inhale 2 puffs into the lungs every 6 (six) hours as needed for wheezing or shortness of breath. 12/29/22   Willy Eddy, MD  benzonatate (TESSALON) 100 MG capsule Take 2 capsules (200 mg total) by mouth every 8 (eight) hours. Patient not taking: Reported on 02/07/2022 04/12/21   Becky Augusta, NP  HYDROcodone-acetaminophen (NORCO) 5-325 MG tablet Take 1 tablet by mouth every 6 (six) hours as needed for moderate pain. Patient not taking: Reported on 12/14/2022 07/20/22   Irean Hong, MD  ibuprofen (ADVIL) 600 MG tablet Take 1 tablet (600 mg total) by mouth every 8 (eight) hours as needed. Patient not taking: Reported on 12/14/2022 07/20/22   Irean Hong, MD  ipratropium (ATROVENT) 0.06 % nasal spray Place 2 sprays into both nostrils 4 (four) times  daily. Patient not taking: Reported on 02/07/2022 04/12/21   Becky Augusta, NP  metroNIDAZOLE (FLAGYL) 500 MG tablet Take 1 tablet (500 mg total) by mouth 2 (two) times daily. Patient not taking: Reported on 02/07/2022 12/07/21   Merrilee Jansky, MD  naproxen (NAPROSYN) 375 MG tablet Take 1 tablet (375 mg total) by mouth 2 (two) times daily as needed for moderate pain. Patient not taking: Reported on 02/07/2022 11/28/20   Tommie Sams, DO  ondansetron (ZOFRAN-ODT) 4 MG disintegrating tablet Take 1 tablet (4 mg total) by mouth every 8 (eight) hours as needed for nausea or vomiting. 09/25/22   Minna Antis, MD  predniSONE (STERAPRED UNI-PAK 21 TAB) 10 MG (21) TBPK tablet Take by mouth daily. As directed 12/14/22   Mickie Bail, NP  promethazine-dextromethorphan (PROMETHAZINE-DM) 6.25-15 MG/5ML syrup Take 5 mLs by mouth 4 (four) times daily as needed. Patient not taking: Reported on 02/07/2022 04/12/21   Becky Augusta, NP  mirtazapine (REMERON) 7.5 MG tablet Take 7.5 mg by mouth at bedtime. Patient not taking: No sig reported 04/19/20 11/28/20  [provider]    Physical Exam   Triage Vital Signs: ED Triage Vitals [03/10/23 0322]  Encounter Vitals Group     BP 129/84     Systolic BP Percentile      Diastolic BP Percentile      Pulse Rate (!) 105  Resp 19     Temp 98.9 F (37.2 C)     Temp Source Oral     SpO2 100 %     Weight      Height      Head Circumference      Peak Flow      Pain Score 8     Pain Loc      Pain Education      Exclude from Growth Chart     Most recent vital signs: Vitals:   03/10/23 0322  BP: 129/84  Pulse: (!) 105  Resp: 19  Temp: 98.9 F (37.2 C)  SpO2: 100%     CONSTITUTIONAL: Alert and responds appropriately to questions. Well-appearing; well-nourished HEAD: Normocephalic, atraumatic EYES: Conjunctivae clear, pupils appear equal ENT: normal nose; moist mucous membranes NECK: Normal range of motion CARD: Regular rate and rhythm RESP:  Normal chest excursion without splinting or tachypnea; no hypoxia or respiratory distress, speaking full sentences ABD/GI: non-distended EXT: Tender to palpation over the left shoulder and elbow diffusely without soft tissue swelling, ecchymosis or deformity.  I am able to passively range each joint but she does resist me due to pain.  2+ left radial pulse.  Compartments are soft.  Normal cap refill. SKIN: Normal color for age and race, no rashes on exposed skin NEURO: Moves all extremities equally, normal speech, no facial asymmetry noted PSYCH: The patient's mood and manner are appropriate. Grooming and personal hygiene are appropriate.  ED Results / Procedures / Treatments   LABS: (all labs ordered are listed, but only abnormal results are displayed) Labs Reviewed - No data to display   EKG:  EKG Interpretation Date/Time:    Ventricular Rate:    PR Interval:    QRS Duration:    QT Interval:    QTC Calculation:   R Axis:      Text Interpretation:            RADIOLOGY: My personal review and interpretation of imaging: X-rays show no fracture or dislocation.  I have personally reviewed all radiology reports. DG Shoulder Left  Result Date: 03/10/2023 CLINICAL DATA:  Left shoulder pain with decreased range of motion. EXAM: LEFT SHOULDER - 2+ VIEW COMPARISON:  None Available. FINDINGS: There is no evidence of fracture or dislocation. There is no evidence of arthropathy or other focal bone abnormality. Soft tissues are unremarkable. IMPRESSION: Negative. Electronically Signed   By: Signa Kell M.D.   On: 03/10/2023 05:45   DG Elbow Complete Left  Result Date: 03/10/2023 CLINICAL DATA:  Left elbow pain, injury, heard pop. EXAM: LEFT ELBOW - COMPLETE 3+ VIEW COMPARISON:  None Available. FINDINGS: There is no evidence of fracture, dislocation, or joint effusion. There is no evidence of arthropathy or other focal bone abnormality. Soft tissues are unremarkable. IMPRESSION:  Negative. Electronically Signed   By: Charlett Nose M.D.   On: 03/10/2023 03:52     PROCEDURES:  Critical Care performed: No      Procedures    IMPRESSION / MDM / ASSESSMENT AND PLAN / ED COURSE  I reviewed the triage vital signs and the nursing notes.   Patient here with left elbow and left shoulder pain.     DIFFERENTIAL DIAGNOSIS (includes but not limited to):   Sprain, strain, less likely fracture, dislocation  Patient's presentation is most consistent with acute complicated illness / injury requiring diagnostic workup.  PLAN: Will obtain x-rays of the left elbow and left shoulder.  Will give ibuprofen  for pain.   MEDICATIONS GIVEN IN ED: Medications  ibuprofen (ADVIL) tablet 800 mg (800 mg Oral Given 03/10/23 0504)     ED COURSE: X-rays reviewed and turbid by myself and the radiologist and show no acute abnormality.  Recommended rest, elevation, ice, alternating Tylenol Motrin.  Will give orthopedic follow-up if symptoms not improving with conservative medical management.   CONSULTS:  none   OUTSIDE RECORDS REVIEWED: Reviewed prior family medicine notes in 2024.     FINAL CLINICAL IMPRESSION(S) / ED DIAGNOSES   Final diagnoses:  Left arm pain     Rx / DC Orders   ED Discharge Orders     None        Note:  This document was prepared using Dragon voice recognition software and may include unintentional dictation errors.   Jeannetta Cerutti, Layla Maw, DO 03/10/23 0549    Suriyah Vergara, Layla Maw, DO 03/10/23 [phone]

## 2023-03-10 NOTE — Discharge Instructions (Addendum)
X-ray showed no fracture or dislocation.  You may alternate Tylenol 1000 mg every 6 hours as needed for pain, fever and Ibuprofen 800 mg every 6-8 hours as needed for pain, fever.  Please take Ibuprofen with food.  Do not take more than 4000 mg of Tylenol (acetaminophen) in a 24 hour period.

## 2023-03-10 NOTE — ED Triage Notes (Signed)
Pt to ED via POV c/o left elbow injury. Pt reports she was trying to get off bed when she positioned her arm in a weird way and hear her elbow pop. Pt unable to move arm due to pain, says it feels numb +radial pulse.

## 2023-04-02 ENCOUNTER — Ambulatory Visit
Admission: EM | Admit: 2023-04-02 | Discharge: 2023-04-02 | Disposition: A | Discharge status: Home / Self Care | Payer: BC Managed Care – PPO | Attending: Emergency Medicine | Admitting: Emergency Medicine

## 2023-04-02 ENCOUNTER — Encounter: Payer: Self-pay | Admitting: Emergency Medicine

## 2023-04-02 DIAGNOSIS — B349 Viral infection, unspecified: Secondary | ICD-10-CM

## 2023-04-02 LAB — POCT RAPID STREP A (OFFICE): Rapid Strep A Screen: NEGATIVE

## 2023-04-02 MED ORDER — IBUPROFEN 800 MG PO TABS: 800.0000 mg | ORAL_TABLET | Freq: Three times a day (TID) | ORAL | 0 refills | Status: DC

## 2023-04-02 MED ORDER — NYSTATIN 100000 UNIT/ML MT SUSP: 5.0000 mL | Freq: Four times a day (QID) | OROMUCOSAL | 0 refills | Status: DC | PRN

## 2023-04-02 NOTE — ED Provider Notes (Signed)
Renaldo Fiddler    CSN: [id number] Arrival date & time: 04/02/23  1626      History   Chief Complaint Chief Complaint  Patient presents with   Sore Throat    HPI Casey Miller is a 22 y.o. female.   Patient presents for evaluation of subjective fever and chills sore throat, bilateral ear popping and a nonproductive cough beginning 3 to 4 days ago.  Known exposure to COVID-19, home testing 1 day ago negative.  Tolerating food and liquids.  Has been using 100 mg of ibuprofen which has been helpful.  History of a tonsillectomy.    Past Medical History:  Diagnosis Date   Depression    Migraine     Patient Active Problem List   Diagnosis Date Noted   ALLERGIC RHINITIS 05/14/2010    Past Surgical History:  Procedure Laterality Date   TONSILLECTOMY     TYMPANOSTOMY TUBE PLACEMENT      OB History     Gravida  0   Para  0   Term  0   Preterm  0   AB  0   Living  0      SAB  0   IAB  0   Ectopic  0   Multiple  0   Live Births  0            Home Medications    Prior to Admission medications   Medication Sig Start Date End Date Taking? Authorizing Provider  ibuprofen (ADVIL) 800 MG tablet Take 1 tablet (800 mg total) by mouth 3 (three) times daily. 04/02/23  Yes Ghassan Coggeshall, Elita Boone, NP  magic mouthwash (nystatin, lidocaine, diphenhydrAMINE, alum & mag hydroxide) suspension Swish and spit 5 mLs 4 (four) times daily as needed for mouth pain. 04/02/23  Yes Marialuisa Basara, Elita Boone, NP  acetaminophen (TYLENOL) 325 MG tablet Take 650 mg by mouth every 6 (six) hours as needed.    [provider]  albuterol (VENTOLIN HFA) 108 (90 Base) MCG/ACT inhaler Inhale 1-2 puffs into the lungs every 6 (six) hours as needed. 12/14/22   Mickie Bail, NP  albuterol (VENTOLIN HFA) 108 (90 Base) MCG/ACT inhaler Inhale 2 puffs into the lungs every 6 (six) hours as needed for wheezing or shortness of breath. 12/29/22   Willy Eddy, MD  benzonatate (TESSALON) 100 MG  capsule Take 2 capsules (200 mg total) by mouth every 8 (eight) hours. Patient not taking: Reported on 02/07/2022 04/12/21   Becky Augusta, NP  HYDROcodone-acetaminophen (NORCO) 5-325 MG tablet Take 1 tablet by mouth every 6 (six) hours as needed for moderate pain. Patient not taking: Reported on 12/14/2022 07/20/22   Irean Hong, MD  ibuprofen (ADVIL) 600 MG tablet Take 1 tablet (600 mg total) by mouth every 8 (eight) hours as needed. Patient not taking: Reported on 12/14/2022 07/20/22   Irean Hong, MD  ipratropium (ATROVENT) 0.06 % nasal spray Place 2 sprays into both nostrils 4 (four) times daily. Patient not taking: Reported on 02/07/2022 04/12/21   Becky Augusta, NP  metroNIDAZOLE (FLAGYL) 500 MG tablet Take 1 tablet (500 mg total) by mouth 2 (two) times daily. Patient not taking: Reported on 02/07/2022 12/07/21   Merrilee Jansky, MD  naproxen (NAPROSYN) 375 MG tablet Take 1 tablet (375 mg total) by mouth 2 (two) times daily as needed for moderate pain. Patient not taking: Reported on 02/07/2022 11/28/20   Tommie Sams, DO  ondansetron (ZOFRAN-ODT) 4 MG disintegrating tablet  Take 1 tablet (4 mg total) by mouth every 8 (eight) hours as needed for nausea or vomiting. 09/25/22   Minna Antis, MD  predniSONE (STERAPRED UNI-PAK 21 TAB) 10 MG (21) TBPK tablet Take by mouth daily. As directed 12/14/22   Mickie Bail, NP  promethazine-dextromethorphan (PROMETHAZINE-DM) 6.25-15 MG/5ML syrup Take 5 mLs by mouth 4 (four) times daily as needed. Patient not taking: Reported on 02/07/2022 04/12/21   Becky Augusta, NP  mirtazapine (REMERON) 7.5 MG tablet Take 7.5 mg by mouth at bedtime. Patient not taking: No sig reported 04/19/20 11/28/20  [provider]    Family History Family History  Problem Relation Age of Onset   Cancer Paternal Grandmother    Cancer Maternal Grandmother    Hypertension Father    Migraines Father    Cancer Father    Migraines Mother    Mental illness Sister     Social  History Social History   Tobacco Use   Smoking status: Never    Passive exposure: Yes   Smokeless tobacco: Never   Tobacco comments:    mother smokes outside  Vaping Use   Vaping status: Some Days  Substance Use Topics   Alcohol use: No   Drug use: No     Allergies   Amoxicillin   Review of Systems Review of Systems   Physical Exam Triage Vital Signs ED Triage Vitals  Encounter Vitals Group     BP 04/02/23 1706 113/74     Systolic BP Percentile --      Diastolic BP Percentile --      Pulse Rate 04/02/23 1706 81     Resp 04/02/23 1706 18     Temp 04/02/23 1706 97.9 F (36.6 C)     Temp Source 04/02/23 1706 Oral     SpO2 04/02/23 1706 99 %     Weight --      Height --      Head Circumference --      Peak Flow --      Pain Score 04/02/23 1705 7     Pain Loc --      Pain Education --      Exclude from Growth Chart --    No data found.  Updated Vital Signs BP 113/74 (BP Location: Left Arm)   Pulse 81   Temp 97.9 F (36.6 C) (Oral)   Resp 18   SpO2 99%   Visual Acuity Right Eye Distance:   Left Eye Distance:   Bilateral Distance:    Right Eye Near:   Left Eye Near:    Bilateral Near:     Physical Exam Constitutional:      Appearance: She is well-developed.  HENT:     Right Ear: Tympanic membrane and ear canal normal.     Left Ear: Tympanic membrane and ear canal normal.     Nose: No congestion or rhinorrhea.     Mouth/Throat:     Mouth: Mucous membranes are moist.     Tonsils: No tonsillar exudate. 0 on the right. 0 on the left.  Cardiovascular:     Rate and Rhythm: Normal rate and regular rhythm.     Heart sounds: Normal heart sounds.  Pulmonary:     Effort: Pulmonary effort is normal.  Musculoskeletal:     Cervical back: Normal range of motion and neck supple.  Skin:    General: Skin is warm and dry.  Neurological:     Mental Status: She is alert.  UC Treatments / Results  Labs (all labs ordered are listed, but only abnormal  results are displayed) Labs Reviewed  POCT RAPID STREP A (OFFICE)    EKG   Radiology No results found.  Procedures Procedures (including critical care time)  Medications Ordered in UC Medications - No data to display  Initial Impression / Assessment and Plan / UC Course  I have reviewed the triage vital signs and the nursing notes.  Pertinent labs & imaging results that were available during my care of the patient were reviewed by me and considered in my medical decision making (see chart for details).  Viral illness  Patient is in no signs of distress nor toxic appearing.  Vital signs are stable.  Low suspicion for pneumonia, pneumothorax or bronchitis and therefore will defer imaging.  Rapid strep testing negative.  Discussed findings.  Will not repeat viral testing as home testing has already been negative.  Prescribed buprofen 800 mg and Magic mouthwash. May use additional over-the-counter medications as needed for supportive care.  May follow-up with urgent care as needed if symptoms persist or worsen.  Note given.   Final Clinical Impressions(s) / UC Diagnoses   Final diagnoses:  Viral illness     Discharge Instructions      Your symptoms today are most likely being caused by a virus and should steadily improve in time it can take up to 7 to 10 days before you truly start to see a turnaround however things will get better  Rapid strep test is negative  May gargle and spit Magic mouthwash solution every 4-6 hours as needed to prevent temporary relief to your throat    You can take Tylenol and/or Ibuprofen as needed for fever reduction and pain relief.   For cough: honey 1/2 to 1 teaspoon (you can dilute the honey in water or another fluid).  You can also use guaifenesin and dextromethorphan for cough. You can use a humidifier for chest congestion and cough.  If you don't have a humidifier, you can sit in the bathroom with the hot shower running.      For sore  throat: try warm salt water gargles, cepacol lozenges, throat spray, warm tea or water with lemon/honey, popsicles or ice, or OTC cold relief medicine for throat discomfort.   For congestion: take a daily anti-histamine like Zyrtec, Claritin, and a oral decongestant, such as pseudoephedrine.  You can also use Flonase 1-2 sprays in each nostril daily.   It is important to stay hydrated: drink plenty of fluids (water, gatorade/powerade/pedialyte, juices, or teas) to keep your throat moisturized and help further relieve irritation/discomfort.    ED Prescriptions     Medication Sig Dispense Auth. Provider   ibuprofen (ADVIL) 800 MG tablet Take 1 tablet (800 mg total) by mouth 3 (three) times daily. 21 tablet Salli Quarry R, NP   magic mouthwash (nystatin, lidocaine, diphenhydrAMINE, alum & mag hydroxide) suspension Swish and spit 5 mLs 4 (four) times daily as needed for mouth pain. 180 mL Valinda Hoar, NP      PDMP not reviewed this encounter.   Valinda Hoar, NP 04/02/23 1739

## 2023-04-02 NOTE — ED Triage Notes (Signed)
Pt presents with a sore throat x 3-4 days.

## 2023-04-02 NOTE — Discharge Instructions (Signed)
Your symptoms today are most likely being caused by a virus and should steadily improve in time it can take up to 7 to 10 days before you truly start to see a turnaround however things will get better  Rapid strep test is negative  May gargle and spit Magic mouthwash solution every 4-6 hours as needed to prevent temporary relief to your throat    You can take Tylenol and/or Ibuprofen as needed for fever reduction and pain relief.   For cough: honey 1/2 to 1 teaspoon (you can dilute the honey in water or another fluid).  You can also use guaifenesin and dextromethorphan for cough. You can use a humidifier for chest congestion and cough.  If you don't have a humidifier, you can sit in the bathroom with the hot shower running.      For sore throat: try warm salt water gargles, cepacol lozenges, throat spray, warm tea or water with lemon/honey, popsicles or ice, or OTC cold relief medicine for throat discomfort.   For congestion: take a daily anti-histamine like Zyrtec, Claritin, and a oral decongestant, such as pseudoephedrine.  You can also use Flonase 1-2 sprays in each nostril daily.   It is important to stay hydrated: drink plenty of fluids (water, gatorade/powerade/pedialyte, juices, or teas) to keep your throat moisturized and help further relieve irritation/discomfort.

## 2023-06-15 ENCOUNTER — Emergency Department: Payer: BC Managed Care – PPO

## 2023-06-15 ENCOUNTER — Emergency Department
Admission: EM | Admit: 2023-06-15 | Discharge: 2023-06-15 | Disposition: A | Discharge status: Home / Self Care | Payer: BC Managed Care – PPO | Attending: Emergency Medicine | Admitting: Emergency Medicine

## 2023-06-15 ENCOUNTER — Other Ambulatory Visit: Payer: Self-pay

## 2023-06-15 DIAGNOSIS — M7989 Other specified soft tissue disorders: Secondary | ICD-10-CM | POA: Diagnosis not present

## 2023-06-15 DIAGNOSIS — M25562 Pain in left knee: Secondary | ICD-10-CM | POA: Insufficient documentation

## 2023-06-15 DIAGNOSIS — M79605 Pain in left leg: Secondary | ICD-10-CM | POA: Insufficient documentation

## 2023-06-15 LAB — POC URINE PREG, ED: Preg Test, Ur: NEGATIVE

## 2023-06-15 MED ORDER — ACETAMINOPHEN 500 MG PO TABS
1000.0000 mg | ORAL_TABLET | Freq: Once | ORAL | Status: AC
  Administered 2023-06-15: 1000 mg via ORAL
  Filled 2023-06-15: qty 2

## 2023-06-15 MED ORDER — OXYCODONE HCL 5 MG PO TABS
5.0000 mg | ORAL_TABLET | Freq: Once | ORAL | Status: AC
  Administered 2023-06-15: 5 mg via ORAL
  Filled 2023-06-15: qty 1

## 2023-06-15 MED ORDER — OXYCODONE HCL 5 MG PO TABS: 5.0000 mg | ORAL_TABLET | Freq: Four times a day (QID) | ORAL | 0 refills | Status: AC | PRN

## 2023-06-15 MED ORDER — KETOROLAC TROMETHAMINE 30 MG/ML IJ SOLN
30.0000 mg | Freq: Once | INTRAMUSCULAR | Status: AC
  Administered 2023-06-15: 30 mg via INTRAMUSCULAR
  Filled 2023-06-15: qty 1

## 2023-06-15 NOTE — ED Triage Notes (Signed)
Pt comes with left knee injury. Pt states week ago it was swollen and has been locking up. Pt states hx of this but was told nothing was wrong.

## 2023-06-15 NOTE — ED Provider Notes (Signed)
[facility] Provider Note    Event Date/Time   First MD Initiated Contact with Patient 06/15/23 [phone]     (approximate)   History   Knee Injury   HPI  Casey Miller is a 22 y.o. female with history of left knee injury who comes in with concerns for continued left leg pain.  Patient reports that 1 year ago she was doing leg presses when a weight came back and smashed into her leg.  She stated that she followed up with [facility] and they gave her a brace and recommended PT and she has been doing that without any improvement.  She reports that it will cause some intermittent tingling, shooting pain down the leg.  She also reports some swelling of the leg. PT in on depot shot.  They report that when the injury initially happened a year ago that there was some discoloration issues but eventually that this went away.  They then report that she did get hit in the knee with the dog on Saturday and this seemed to flareup and started again with the pain, discolaration issues.  I reviewed the note from 12/doctor.  They have referred her to orthopedics.   Physical Exam   Triage Vital Signs: ED Triage Vitals  Encounter Vitals Group     BP 06/15/23 1427 (!) 124/105     Systolic BP Percentile --      Diastolic BP Percentile --      Pulse Rate 06/15/23 1427 88     Resp 06/15/23 1427 18     Temp 06/15/23 1427 98.2 F (36.8 C)     Temp src --      SpO2 06/15/23 1427 100 %     Weight 06/15/23 1425 120 lb (54.4 kg)     Height 06/15/23 1425 6' (1.829 m)     Head Circumference --      Peak Flow --      Pain Score 06/15/23 1425 10     Pain Loc --      Pain Education --      Exclude from Growth Chart --     Most recent vital signs: Vitals:   06/15/23 1427  BP: (!) 124/105  Pulse: 88  Resp: 18  Temp: 98.2 F (36.8 C)  SpO2: 100%     General: Awake, no distress.  CV:  Good peripheral perfusion.  Resp:  Normal effort.  Abd:  No distention.  Other:  No CTL  spine tenderness. She has good distal pulse, slight swelling noted and calf tenderness noted.  Slight discolaration noted. She has significant tenderness over the lateral aspect of the knee.  She is able to flex and extend her ankle.Did confirm pulses with doppler.    ED Results / Procedures / Treatments   Labs (all labs ordered are listed, but only abnormal results are displayed) Labs Reviewed  POC URINE PREG, ED      RADIOLOGY I have reviewed the xray personally and interpreted no evidence of any fractures  PROCEDURES:  Critical Care performed: No  Procedures   MEDICATIONS ORDERED IN ED: Medications  oxyCODONE (Oxy IR/ROXICODONE) immediate release tablet 5 mg (5 mg Oral Given 06/15/23 1631)  ketorolac (TORADOL) 30 MG/ML injection 30 mg (30 mg Intramuscular Given 06/15/23 1632)  acetaminophen (TYLENOL) tablet 1,000 mg (1,000 mg Oral Given 06/15/23 1630)     IMPRESSION / MDM / ASSESSMENT AND PLAN / ED COURSE  I reviewed the triage vital signs and  the nursing notes.   Patient's presentation is most consistent with acute presentation with potential threat to life or bodily function.   Patient comes in with significant left knee pain radiating down the leg from an injury over a year ago but pain seems to be getting worse more recently.  Denies any new injuries except dog bumping into it. X-ray ordered to evaluate for fractures, dislocation this was negative.  Will get ultrasound to make sure there is no DVT.  Patient seems to have a good pulse.  Patient given some oxycodone, Toradol to help with pain.  Pregnancy test was negative.  I did use a Doppler to confirm that patient does have distal DP and PT pulses although I was able to feel them with my hand I wanted to show the mom and the daughter that they were present and very normal sounding.  We discussed CT angio given the discoloration of the leg.  We talked about how it could be better to evaluate for any pseudoaneurysm,  vasculitis, other issues but the fact that she is got good distal pulses it is very re-assuring. We discussed the risk of radiation.  Discoloration previously they have opted to want to hold off on the CT angio and follow-up with orthopedics first.  Has been a recurring issue over the past year we discussed the possibility of complex regional pain syndrome given she does report having this issue previously with a discoloration and she had a reinjury with the discoloration the past few days.  Given that the ultrasound of the DVT is negative and she is good good distal pulses they have opted to want to hold off on CT angio and have close follow-up with orthopedics- I sent a message to dr Rosita Kea who pt has f.u with on thursday   We discussed pain medications and the risk for addiction but they would have wanted to try this until she can get follow-up with orthopedics.  Mom is at bedside and is comfortable with the above plan and they understand close return precautions or if they decide they would like to undergo the CT angio they can return to the ER for repeat evaluation and consideration these episodes are not happening in any other area to suggest a vasculitis.     FINAL CLINICAL IMPRESSION(S) / ED DIAGNOSES   Final diagnoses:  Acute pain of left knee     Rx / DC Orders   ED Discharge Orders     None        Note:  This document was prepared using Dragon voice recognition software and may include unintentional dictation errors.   Concha Se, MD 06/15/23 [phone]

## 2023-06-15 NOTE — ED Notes (Signed)
Pt to US.

## 2023-06-15 NOTE — ED Notes (Signed)
Pt up to the restroom to provide urine sample

## 2023-06-15 NOTE — Discharge Instructions (Addendum)
We discussed CT angio but of opted to hold off due to patient having good pulses.  You are to follow-up with orthopedics.  This could be complex regional pain syndrome however if develop a change in symptoms you should return to the ER immediately for repeat evaluation.  Take oxycodone as prescribed. Do not drink alcohol, drive or participate in any other potentially dangerous activities while taking this medication as it may make you sleepy. Do not take this medication with any other sedating medications, either prescription or over-the-counter. If you were prescribed Percocet or Vicodin, do not take these with acetaminophen (Tylenol) as it is already contained within these medications.  This medication is an opiate (or narcotic) pain medication and can be habit forming. Use it as little as possible to achieve adequate pain control. Do not use or use it with extreme caution if you have a history of opiate abuse or dependence. If you are on a pain contract with your primary care doctor or a pain specialist, be sure to let them know you were prescribed this medication today from the Emergency Department. This medication is intended for your use only - do not give any to anyone else and keep it in a secure place where nobody else, especially children, have access to it.

## 2023-06-22 ENCOUNTER — Other Ambulatory Visit: Payer: Self-pay | Admitting: Orthopedic Surgery

## 2023-06-22 DIAGNOSIS — S83252A Bucket-handle tear of lateral meniscus, current injury, left knee, initial encounter: Secondary | ICD-10-CM

## 2023-07-09 ENCOUNTER — Other Ambulatory Visit: Payer: Self-pay

## 2023-07-09 ENCOUNTER — Ambulatory Visit
Admission: EM | Admit: 2023-07-09 | Discharge: 2023-07-09 | Disposition: A | Discharge status: Home / Self Care | Payer: BC Managed Care – PPO | Attending: Emergency Medicine | Admitting: Emergency Medicine

## 2023-07-09 ENCOUNTER — Encounter: Payer: Self-pay | Admitting: *Deleted

## 2023-07-09 DIAGNOSIS — J069 Acute upper respiratory infection, unspecified: Secondary | ICD-10-CM | POA: Diagnosis not present

## 2023-07-09 LAB — POC COVID19/FLU A&B COMBO
Covid Antigen, POC: NEGATIVE
Influenza A Antigen, POC: NEGATIVE
Influenza B Antigen, POC: NEGATIVE

## 2023-07-09 MED ORDER — PROMETHAZINE-DM 6.25-15 MG/5ML PO SYRP: 5.0000 mL | ORAL_SOLUTION | Freq: Every evening | ORAL | 0 refills | Status: DC | PRN

## 2023-07-09 MED ORDER — PREDNISONE 10 MG (21) PO TBPK: ORAL_TABLET | Freq: Every day | ORAL | 0 refills | Status: DC

## 2023-07-09 MED ORDER — BENZONATATE 100 MG PO CAPS: 100.0000 mg | ORAL_CAPSULE | Freq: Three times a day (TID) | ORAL | 0 refills | Status: DC

## 2023-07-09 NOTE — Discharge Instructions (Signed)
Your symptoms today are most likely being caused by a virus and should steadily improve in time it can take up to 7 to 10 days before you truly start to see a turnaround however things will get better  COVID and flu negative  In begin prednisone every morning with food as directed to open and relax the airway should help with shortness of breath and wheezing  May use Tessalon pill every 8 hours as needed for cough and congestion, may use cough syrup at bedtime for comfort     You can take Tylenol and/or Ibuprofen as needed for fever reduction and pain relief.   For cough: honey 1/2 to 1 teaspoon (you can dilute the honey in water or another fluid).  You can also use guaifenesin and dextromethorphan for cough. You can use a humidifier for chest congestion and cough.  If you don't have a humidifier, you can sit in the bathroom with the hot shower running.      For sore throat: try warm salt water gargles, cepacol lozenges, throat spray, warm tea or water with lemon/honey, popsicles or ice, or OTC cold relief medicine for throat discomfort.   For congestion: take a daily anti-histamine like Zyrtec, Claritin, and a oral decongestant, such as pseudoephedrine.  You can also use Flonase 1-2 sprays in each nostril daily.   It is important to stay hydrated: drink plenty of fluids (water, gatorade/powerade/pedialyte, juices, or teas) to keep your throat moisturized and help further relieve irritation/discomfort.

## 2023-07-09 NOTE — ED Provider Notes (Addendum)
Renaldo Fiddler    CSN: [id number] Arrival date & time: 07/09/23  1251      History   Chief Complaint Chief Complaint  Patient presents with   Sore Throat   Cough   Fever   Headache    HPI WYVONNE HOLDER is a 22 y.o. female.   Patient presents for evaluation of fever peaking at 100, intermittent generalized headaches, nasal congestion, rhinorrhea, sore throat, centralized chest burning, shortness of breath with exertion and wheezing present for 3 days.  Associated diarrhea.  Known exposure to COVID.  Denies respiratory history but does vape intermittently.  Poor appetite but able to tolerate some food and liquids.  Has attempted use of over-the-counter day and cold medicine.   Past Medical History:  Diagnosis Date   Depression    Migraine     Patient Active Problem List   Diagnosis Date Noted   Allergic rhinitis 05/14/2010    Past Surgical History:  Procedure Laterality Date   TONSILLECTOMY     TYMPANOSTOMY TUBE PLACEMENT      OB History     Gravida  0   Para  0   Term  0   Preterm  0   AB  0   Living  0      SAB  0   IAB  0   Ectopic  0   Multiple  0   Live Births  0            Home Medications    Prior to Admission medications   Medication Sig Start Date End Date Taking? Authorizing Provider  benzonatate (TESSALON) 100 MG capsule Take 1 capsule (100 mg total) by mouth every 8 (eight) hours. 07/09/23  Yes Lise Pincus R, NP  predniSONE (STERAPRED UNI-PAK 21 TAB) 10 MG (21) TBPK tablet Take by mouth daily. Take 6 tabs by mouth daily  for 1 days, then 5 tabs for 1 days, then 4 tabs for 1 days, then 3 tabs for 1 days, 2 tabs for 1 days, then 1 tab by mouth daily for 1 days 07/09/23  Yes Lylah Lantis R, NP  promethazine-dextromethorphan (PROMETHAZINE-DM) 6.25-15 MG/5ML syrup Take 5 mLs by mouth at bedtime as needed. 07/09/23  Yes Salene Mohamud, Elita Boone, NP  acetaminophen (TYLENOL) 325 MG tablet Take 650 mg by mouth every 6 (six)  hours as needed.    [provider]  albuterol (VENTOLIN HFA) 108 (90 Base) MCG/ACT inhaler Inhale 1-2 puffs into the lungs every 6 (six) hours as needed. 12/14/22   Mickie Bail, NP  albuterol (VENTOLIN HFA) 108 (90 Base) MCG/ACT inhaler Inhale 2 puffs into the lungs every 6 (six) hours as needed for wheezing or shortness of breath. 12/29/22   Willy Eddy, MD  HYDROcodone-acetaminophen (NORCO) 5-325 MG tablet Take 1 tablet by mouth every 6 (six) hours as needed for moderate pain. Patient not taking: Reported on 12/14/2022 07/20/22   Irean Hong, MD  ibuprofen (ADVIL) 600 MG tablet Take 1 tablet (600 mg total) by mouth every 8 (eight) hours as needed. Patient not taking: Reported on 12/14/2022 07/20/22   Irean Hong, MD  ibuprofen (ADVIL) 800 MG tablet Take 1 tablet (800 mg total) by mouth 3 (three) times daily. 04/02/23   Anaja Monts, Elita Boone, NP  ipratropium (ATROVENT) 0.06 % nasal spray Place 2 sprays into both nostrils 4 (four) times daily. Patient not taking: Reported on 02/07/2022 04/12/21   Becky Augusta, NP  magic mouthwash (nystatin, lidocaine, diphenhydrAMINE,  alum & mag hydroxide) suspension Swish and spit 5 mLs 4 (four) times daily as needed for mouth pain. 04/02/23   Tyresha Fede, Elita Boone, NP  metroNIDAZOLE (FLAGYL) 500 MG tablet Take 1 tablet (500 mg total) by mouth 2 (two) times daily. Patient not taking: Reported on 02/07/2022 12/07/21   Merrilee Jansky, MD  naproxen (NAPROSYN) 375 MG tablet Take 1 tablet (375 mg total) by mouth 2 (two) times daily as needed for moderate pain. Patient not taking: Reported on 02/07/2022 11/28/20   Tommie Sams, DO  ondansetron (ZOFRAN-ODT) 4 MG disintegrating tablet Take 1 tablet (4 mg total) by mouth every 8 (eight) hours as needed for nausea or vomiting. 09/25/22   Minna Antis, MD  mirtazapine (REMERON) 7.5 MG tablet Take 7.5 mg by mouth at bedtime. Patient not taking: No sig reported 04/19/20 11/28/20  [provider]    Family  History Family History  Problem Relation Age of Onset   Cancer Paternal Grandmother    Cancer Maternal Grandmother    Hypertension Father    Migraines Father    Cancer Father    Migraines Mother    Mental illness Sister     Social History Social History   Tobacco Use   Smoking status: Never    Passive exposure: Yes   Smokeless tobacco: Never   Tobacco comments:    mother smokes outside  Vaping Use   Vaping status: Some Days  Substance Use Topics   Alcohol use: No   Drug use: No     Allergies   Amoxicillin   Review of Systems Review of Systems   Physical Exam Triage Vital Signs ED Triage Vitals  Encounter Vitals Group     BP 07/09/23 1424 109/73     Systolic BP Percentile --      Diastolic BP Percentile --      Pulse Rate 07/09/23 1424 (!) 102     Resp 07/09/23 1424 16     Temp 07/09/23 1424 98.3 F (36.8 C)     Temp Source 07/09/23 1424 Oral     SpO2 07/09/23 1424 98 %     Weight --      Height --      Head Circumference --      Peak Flow --      Pain Score 07/09/23 1428 6     Pain Loc --      Pain Education --      Exclude from Growth Chart --    No data found.  Updated Vital Signs BP 109/73 (BP Location: Left Arm)   Pulse (!) 102   Temp 98.3 F (36.8 C) (Oral)   Resp 16   LMP 07/04/2023   SpO2 98%   Visual Acuity Right Eye Distance:   Left Eye Distance:   Bilateral Distance:    Right Eye Near:   Left Eye Near:    Bilateral Near:     Physical Exam Constitutional:      Appearance: She is ill-appearing.  HENT:     Head: Normocephalic.     Right Ear: Tympanic membrane, ear canal and external ear normal.     Left Ear: Tympanic membrane, ear canal and external ear normal.     Nose: Congestion present. No rhinorrhea.     Mouth/Throat:     Mouth: Mucous membranes are moist.     Pharynx: Oropharynx is clear. No oropharyngeal exudate or posterior oropharyngeal erythema.  Eyes:     Extraocular Movements: Extraocular  movements intact.   Cardiovascular:     Rate and Rhythm: Normal rate and regular rhythm.     Pulses: Normal pulses.     Heart sounds: Normal heart sounds.  Pulmonary:     Effort: Pulmonary effort is normal.     Breath sounds: Normal breath sounds.  Musculoskeletal:     Cervical back: Normal range of motion and neck supple.  Neurological:     Mental Status: She is alert and oriented to person, place, and time. Mental status is at baseline.      UC Treatments / Results  Labs (all labs ordered are listed, but only abnormal results are displayed) Labs Reviewed  POC COVID19/FLU A&B COMBO - Normal    EKG   Radiology No results found.  Procedures Procedures (including critical care time)  Medications Ordered in UC Medications - No data to display  Initial Impression / Assessment and Plan / UC Course  I have reviewed the triage vital signs and the nursing notes.  Pertinent labs & imaging results that were available during my care of the patient were reviewed by me and considered in my medical decision making (see chart for details).  Viral URI with cough  Patient is in no signs of distress nor toxic appearing.  Vital signs are stable.  Low suspicion for pneumonia, pneumothorax or bronchitis and therefore will defer imaging.  COVID and flu testing negative.  Prescribed prednisone, Tessalon and Promethazine DM.May use additional over-the-counter medications as needed for supportive care.  May follow-up with urgent care as needed if symptoms persist or worsen.  Note given.   Final Clinical Impressions(s) / UC Diagnoses   Final diagnoses:  Viral URI with cough     Discharge Instructions      Your symptoms today are most likely being caused by a virus and should steadily improve in time it can take up to 7 to 10 days before you truly start to see a turnaround however things will get better  COVID and flu negative  In begin prednisone every morning with food as directed to open and relax  the airway should help with shortness of breath and wheezing  May use Tessalon pill every 8 hours as needed for cough and congestion, may use cough syrup at bedtime for comfort     You can take Tylenol and/or Ibuprofen as needed for fever reduction and pain relief.   For cough: honey 1/2 to 1 teaspoon (you can dilute the honey in water or another fluid).  You can also use guaifenesin and dextromethorphan for cough. You can use a humidifier for chest congestion and cough.  If you don't have a humidifier, you can sit in the bathroom with the hot shower running.      For sore throat: try warm salt water gargles, cepacol lozenges, throat spray, warm tea or water with lemon/honey, popsicles or ice, or OTC cold relief medicine for throat discomfort.   For congestion: take a daily anti-histamine like Zyrtec, Claritin, and a oral decongestant, such as pseudoephedrine.  You can also use Flonase 1-2 sprays in each nostril daily.   It is important to stay hydrated: drink plenty of fluids (water, gatorade/powerade/pedialyte, juices, or teas) to keep your throat moisturized and help further relieve irritation/discomfort.    ED Prescriptions     Medication Sig Dispense Auth. Provider   predniSONE (STERAPRED UNI-PAK 21 TAB) 10 MG (21) TBPK tablet Take by mouth daily. Take 6 tabs by mouth daily  for 1 days, then  5 tabs for 1 days, then 4 tabs for 1 days, then 3 tabs for 1 days, 2 tabs for 1 days, then 1 tab by mouth daily for 1 days 21 tablet Bo Teicher R, NP   benzonatate (TESSALON) 100 MG capsule Take 1 capsule (100 mg total) by mouth every 8 (eight) hours. 21 capsule Rey Fors R, NP   promethazine-dextromethorphan (PROMETHAZINE-DM) 6.25-15 MG/5ML syrup Take 5 mLs by mouth at bedtime as needed. 118 mL Jazilyn Siegenthaler, Elita Boone, NP      PDMP not reviewed this encounter.   Valinda Hoar, NP 07/09/23 1535    Valinda Hoar, [place] 07/09/23 1536

## 2023-07-09 NOTE — ED Triage Notes (Signed)
Pt reports for 3 days she has had a cough,fever,HA body aches.

## 2023-07-12 ENCOUNTER — Encounter: Payer: Self-pay | Admitting: Orthopedic Surgery

## 2023-07-18 ENCOUNTER — Ambulatory Visit
Admission: RE | Admit: 2023-07-18 | Discharge: 2023-07-18 | Disposition: A | Discharge status: Home / Self Care | Payer: BC Managed Care – PPO | Source: Ambulatory Visit | Attending: Orthopedic Surgery | Admitting: Orthopedic Surgery

## 2023-07-18 DIAGNOSIS — S83252A Bucket-handle tear of lateral meniscus, current injury, left knee, initial encounter: Secondary | ICD-10-CM

## 2024-02-01 ENCOUNTER — Emergency Department

## 2024-02-01 ENCOUNTER — Other Ambulatory Visit: Payer: Self-pay

## 2024-02-01 DIAGNOSIS — W228XXA Striking against or struck by other objects, initial encounter: Secondary | ICD-10-CM | POA: Diagnosis not present

## 2024-02-01 DIAGNOSIS — R258 Other abnormal involuntary movements: Secondary | ICD-10-CM | POA: Diagnosis not present

## 2024-02-01 DIAGNOSIS — S060X1A Concussion with loss of consciousness of 30 minutes or less, initial encounter: Secondary | ICD-10-CM | POA: Insufficient documentation

## 2024-02-01 DIAGNOSIS — R55 Syncope and collapse: Secondary | ICD-10-CM | POA: Diagnosis present

## 2024-02-01 LAB — CBC
HCT: 39.5 % (ref 36.0–46.0)
Hemoglobin: 13.3 g/dL (ref 12.0–15.0)
MCH: 30 pg (ref 26.0–34.0)
MCHC: 33.7 g/dL (ref 30.0–36.0)
MCV: 89 fL (ref 80.0–100.0)
Platelets: 289 K/uL (ref 150–400)
RBC: 4.44 MIL/uL (ref 3.87–5.11)
RDW: 12.5 % (ref 11.5–15.5)
WBC: 9.7 K/uL (ref 4.0–10.5)
nRBC: 0 % (ref 0.0–0.2)

## 2024-02-01 LAB — BASIC METABOLIC PANEL WITH GFR
Anion gap: 10 (ref 5–15)
BUN: 7 mg/dL (ref 6–20)
CO2: 20 mmol/L — ABNORMAL LOW (ref 22–32)
Calcium: 9.7 mg/dL (ref 8.9–10.3)
Chloride: 107 mmol/L (ref 98–111)
Creatinine, Ser: 0.51 mg/dL (ref 0.44–1.00)
GFR, Estimated: >60 mL/min (ref >60)
Glucose, Bld: 101 mg/dL — ABNORMAL HIGH (ref 70–99)
Potassium: 3.6 mmol/L (ref 3.5–5.1)
Sodium: 137 mmol/L (ref 135–145)

## 2024-02-01 NOTE — ED Triage Notes (Signed)
 Patient wheeled to triage with complaints of possible seizure today. Patient states she was at a concert today standing in line when she passed out and bystanders stated she was shaking on scene like a seizure. Denies drugs/alcohol.  Patient states she does feel like she hit her head on the concrete when she went down.

## 2024-02-02 ENCOUNTER — Emergency Department
Admission: EM | Admit: 2024-02-02 | Discharge: 2024-02-02 | Disposition: A | Discharge status: Home / Self Care | Attending: Emergency Medicine | Admitting: Emergency Medicine

## 2024-02-02 DIAGNOSIS — R569 Unspecified convulsions: Secondary | ICD-10-CM

## 2024-02-02 DIAGNOSIS — R55 Syncope and collapse: Secondary | ICD-10-CM

## 2024-02-02 DIAGNOSIS — S060X1A Concussion with loss of consciousness of 30 minutes or less, initial encounter: Secondary | ICD-10-CM

## 2024-02-02 MED ORDER — IBUPROFEN 600 MG PO TABS
600.0000 mg | ORAL_TABLET | Freq: Once | ORAL | Status: AC
  Administered 2024-02-02: 600 mg via ORAL
  Filled 2024-02-02: qty 1

## 2024-02-02 NOTE — ED Provider Notes (Signed)
 [facility] Provider Note    Event Date/Time   First MD Initiated Contact with Patient 02/02/24 0133     (approximate)   History   Seizures   HPI  Casey Miller is a 23 y.o. female with no significant past medical history who presents with seizure-like activity.  The patient states that she was standing at an outdoor concert when she started to feel a bit lightheaded and noticed black spots in her vision.  Subsequently she collapsed and passed out.  Her mother, who is present, states that she passed out and collapsed, hitting her head on the ground.  She had seizure-like activity characterized as full body convulsions that lasted about 1 minute.  The patient then came back to baseline relatively quickly.  She did not have urinary incontinence.  She did not bite her tongue.  She reports a headache now but denies other acute symptoms.  She denies any prior history of episodes like this.  I reviewed the past medical records.  The patient's most recent outpatient counter was on 7/8 with primary care for a health maintenance visit.     Physical Exam   Triage Vital Signs: ED Triage Vitals  Encounter Vitals Group     BP 02/01/24 2306 136/84     Girls Systolic BP Percentile --      Girls Diastolic BP Percentile --      Boys Systolic BP Percentile --      Boys Diastolic BP Percentile --      Pulse Rate 02/01/24 2306 (!) 102     Resp 02/01/24 2306 20     Temp 02/01/24 2306 98.5 F (36.9 C)     Temp Source 02/01/24 2306 Oral     SpO2 02/01/24 2306 100 %     Weight 02/01/24 2307 118 lb (53.5 kg)     Height 02/01/24 2307 5' 6 (1.676 m)     Head Circumference --      Peak Flow --      Pain Score 02/01/24 2306 5     Pain Loc --      Pain Education --      Exclude from Growth Chart --     Most recent vital signs: Vitals:   02/02/24 0300 02/02/24 0315  BP: 109/67   Pulse: 66   Resp:    Temp:  98.4 F (36.9 C)  SpO2: 100%      General: Alert,  comfortable appearing, no distress.  CV:  Good peripheral perfusion.  Resp:  Normal effort.  Abd:  No distention.  Other:  EOMI.  PERRLA.  Mild photophobia.  No facial droop.  Normal speech.  Motor intact in all extremities.  No ataxia on finger-to-nose.  No pronator drift.   ED Results / Procedures / Treatments   Labs (all labs ordered are listed, but only abnormal results are displayed) Labs Reviewed  BASIC METABOLIC PANEL WITH GFR - Abnormal; Notable for the following components:      Result Value   CO2 20 (*)    Glucose, Bld 101 (*)    All other components within normal limits  CBC  CBG MONITORING, ED  POC URINE PREG, ED     EKG  ED ECG REPORT I, Waylon Cassis, the attending physician, personally viewed and interpreted this ECG.  Date: 02/01/2024 EKG Time: 2312 Rate: 84 Rhythm: normal sinus rhythm QRS Axis: Right axis Intervals: normal ST/T Wave abnormalities: normal Narrative Interpretation: no evidence of acute  ischemia; no significant change when compared to EKG of 12/29/2022    RADIOLOGY  CT head: I independently viewed and interpreted the images; there is no ICH.  Radiology report indicates no acute abnormality.  CT cervical spine: No acute fracture   PROCEDURES:  Critical Care performed: No  Procedures   MEDICATIONS ORDERED IN ED: Medications  ibuprofen  (ADVIL ) tablet 600 mg (600 mg Oral Given 02/02/24 0229)     IMPRESSION / MDM / ASSESSMENT AND PLAN / ED COURSE  I reviewed the triage vital signs and the nursing notes.  23 year old female with no significant past medical history presents with a syncopal episode with subsequent seizure-like activity.  On exam currently her vital signs are normal.  Neurologic exam is nonfocal.  EKG shows a right axis with no acute abnormalities.  Differential diagnosis includes, but is not limited to, vasovagal syncope, dehydration, hypovolemia.  There is no evidence of cardiac etiology.  The lack of  incontinence or tongue biting and the relatively quick return to baseline favor syncope with subsequent seizure-like activity, but the duration of the event is concerning for possible epileptic seizure.  The patient is also now having headache and mild photophobia most consistent with a concussion.  Patient's presentation is most consistent with acute presentation with potential threat to life or bodily function.  The patient is on the cardiac monitor to evaluate for evidence of arrhythmia and/or significant heart rate changes.  CT head and cervical spine were obtained and are negative.  BMP and CBC show no acute findings.  There is no indication for cardiac enzymes.  At this time, the patient is stable for discharge home.  I counseled her and her mother on the results of the workup and plan of care.  I have given neurology referral.  I also advised the patient on [place]  state law requires her to be seizure-free for 6 months before operating a vehicle.  I gave strict return precautions, and the patient expressed understanding.    FINAL CLINICAL IMPRESSION(S) / ED DIAGNOSES   Final diagnoses:  Syncope, unspecified syncope type  Seizure-like activity (HCC)  Concussion with loss of consciousness of 30 minutes or less, initial encounter     Rx / DC Orders   ED Discharge Orders     None        Note:  This document was prepared using Dragon voice recognition software and may include unintentional dictation errors.    Jacolyn Pae, MD 02/02/24 [phone]

## 2024-02-02 NOTE — Discharge Instructions (Addendum)
 It is unclear if the seizure was related to syncope (passing out) or was an actual epileptic seizure caused by the brain.  Make an appointment to follow-up with the neurologist.  Per [place]  state law you must be seizure-free for 6 months before driving.  Return to the ER for new, worsening, or persistent severe headache, vomiting, vision changes, dizziness, recurrent seizure-like episodes, or any other new or worsening symptoms that are concerning.

## 2024-02-04 NOTE — Telephone Encounter (Signed)
 Provider Disposition:911/EMS Mom will take pt back to [facility] ED now.    Reason for call: Mom is calling for:  Chief Complaint  Patient presents with  . Seizures    New-onset  . Head Injury    Today 7/28 - mom reports pt is very drowsy and out of it - able to wake up briefly but then goes back to sleep  Relevant recent hx:  [facility] Friday night 7/25 - Had a seizure and passed out - hit her head when she fell - currently concussed - Was Discharged home from [facility]  Saturday evening 7/26 - mini-seizure  Sunday 7/27 - went to [facility] - scans completed, came back normal - Wanted to transfer her to main campus in [place] for in-depth neuro eval but pt declined and was discharged home with ambulatory referral to neuro      Actions taken during call: Patient advised to call 911 if having an emergency or have someone assist with transportation to the nearest Emergency Room.  Routed encounter to Provider Triage: Triage completed, care advice given per protocol. Triage:Call back parameters given and caller advised of 24 hour nurse triage. Instructed to seek immediate medical attention if new symptoms develop, current symptoms worsen or if you become increasingly concerned. Patient verbalized understanding.  Triage:Triage disposition declined, the patient has been triaged and given the most appropriate disposition for their concerns. The patient is declining the advised disposition. The patient has been educated as appropriate on the risks of delaying or forgoing treatment, and states understanding that they accept these risks. All pertinent patient questions and concerns have been addressed    BURNARD PESA, RN [facility] Patient Engagement Center  Patient Engagement Center Documentation    Reason for Disposition . ACUTE NEUROLOGIC SYMPTOM and symptom present now  Protocols used: Head Injury-A-[place]

## 2024-02-07 ENCOUNTER — Other Ambulatory Visit: Payer: Self-pay

## 2024-02-07 ENCOUNTER — Emergency Department
Admission: EM | Admit: 2024-02-07 | Discharge: 2024-02-07 | Disposition: A | Discharge status: Home / Self Care | Attending: Emergency Medicine | Admitting: Emergency Medicine

## 2024-02-07 ENCOUNTER — Encounter: Payer: Self-pay | Admitting: Emergency Medicine

## 2024-02-07 DIAGNOSIS — G44309 Post-traumatic headache, unspecified, not intractable: Secondary | ICD-10-CM | POA: Diagnosis not present

## 2024-02-07 DIAGNOSIS — R519 Headache, unspecified: Secondary | ICD-10-CM | POA: Diagnosis present

## 2024-02-07 MED ORDER — IBUPROFEN 800 MG PO TABS: 800.0000 mg | ORAL_TABLET | Freq: Three times a day (TID) | ORAL | 0 refills | Status: DC | PRN

## 2024-02-07 MED ORDER — KETOROLAC TROMETHAMINE 30 MG/ML IJ SOLN
15.0000 mg | Freq: Once | INTRAMUSCULAR | Status: AC
  Administered 2024-02-07: 15 mg via INTRAMUSCULAR
  Filled 2024-02-07: qty 1

## 2024-02-07 MED ORDER — METOCLOPRAMIDE HCL 10 MG PO TABS
10.0000 mg | ORAL_TABLET | Freq: Once | ORAL | Status: AC
  Administered 2024-02-07: 10 mg via ORAL
  Filled 2024-02-07: qty 1

## 2024-02-07 MED ORDER — DIPHENHYDRAMINE HCL 25 MG PO CAPS
25.0000 mg | ORAL_CAPSULE | Freq: Once | ORAL | Status: AC
  Administered 2024-02-07: 25 mg via ORAL
  Filled 2024-02-07: qty 1

## 2024-02-07 MED ORDER — METOCLOPRAMIDE HCL 10 MG PO TABS: 10.0000 mg | ORAL_TABLET | Freq: Three times a day (TID) | ORAL | 0 refills | Status: DC

## 2024-02-07 NOTE — ED Triage Notes (Signed)
 Represents to ED today c/o continued headache, fluid leaking from bilateral ears, a blood nose on Tuesday and continued seizures.  AAOx3.  Skin warm and dry. NAD

## 2024-02-07 NOTE — ED Provider Notes (Signed)
 [facility] Emergency Department Provider Note     Event Date/Time   First MD Initiated Contact with Patient 02/07/24 1639     (approximate)   History   Headache   HPI  Casey Miller is a 23 y.o. female with a history of migraine headache, depression, and recent seizure-like activity following a closed injury, presents to the ED endorsing continued headache.  Patient would also describe fluid leaking from the ears bilaterally.  Patient also had a spontaneous nosebleed on Tuesday.  Report from family that patient continues to have seizure-like activity in the interim.  The patient's older sister was present at bedside, shows 2 separate video reveals which appeared to pick the patient sleeping on the sofa, but rhythmically rolling on the sofa, today described as seizure-like activity.  The episodes only occur while the patient is asleep by their report.  Chart review reveals the patient was evaluated at both [facility] and [facility] ED's since initial onset.  She apparently was scheduled for an ED to ED transfer on Sunday, 7/27, but declined ultimately and left AMA from [facility].  Patient has been dosing prescription ibuprofen  (800 mg) for her persistent headaches.  She denies any significant benefit.  No other interventions have been attempted.  No reported nausea, vomiting, or dizziness.  Patient denies any weakness, paralysis, or slurred speech.  No reports of any syncope, vertigo, tinnitus, or hearing loss.  Patient apparently received ambulatory referral to neurology at that time.  She is scheduled to see the neurologist next week on Wednesday.  The patient is able to provide the majority of the HPI during this interview.  Physical Exam   Triage Vital Signs: ED Triage Vitals  Encounter Vitals Group     BP 02/07/24 1626 (!) 133/90     Girls Systolic BP Percentile --      Girls Diastolic BP Percentile --      Boys Systolic BP Percentile --      Boys  Diastolic BP Percentile --      Pulse Rate 02/07/24 1626 83     Resp 02/07/24 1626 16     Temp 02/07/24 1626 99.1 F (37.3 C)     Temp Source 02/07/24 1626 Oral     SpO2 02/07/24 1626 100 %     Weight 02/07/24 1627 117 lb 15.1 oz (53.5 kg)     Height 02/07/24 1635 5' 6 (1.676 m)     Head Circumference --      Peak Flow --      Pain Score --      Pain Loc --      Pain Education --      Exclude from Growth Chart --     Most recent vital signs: Vitals:   02/07/24 1626 02/07/24 1834  BP: (!) 133/90 131/83  Pulse: 83 79  Resp: 16 16  Temp: 99.1 F (37.3 C)   SpO2: 100% 100%    General Awake, no distress. NAD A&O x 4 HEENT NCAT. PERRL. EOMI. normal fundi bilaterally.  No nystagmus appreciated.  No rhinorrhea/epistaxis. Mucous membranes are moist.  TMs intact bilaterally without purulent or serous effusion.  No otorrhea appreciated. CV:  Good peripheral perfusion. RRR RESP:  Normal effort. CTA ABD:  No distention.  Soft and nontender MSK:  AROM of all extremities.  No midline tenderness, spasm, deformity, or step-off to the spine. NEURO: Nerves II to XII grossly intact.   ED Results / Procedures /  Treatments   Labs (all labs ordered are listed, but only abnormal results are displayed) Labs Reviewed - No data to display   EKG   RADIOLOGY   No results found.   PROCEDURES:  Critical Care performed: No  Procedures   MEDICATIONS ORDERED IN ED: Medications  ketorolac  (TORADOL ) 30 MG/ML injection 15 mg (15 mg Intramuscular Given 02/07/24 1725)  metoCLOPramide  (REGLAN ) tablet 10 mg (10 mg Oral Given 02/07/24 1724)  diphenhydrAMINE  (BENADRYL ) capsule 25 mg (25 mg Oral Given 02/07/24 1723)     IMPRESSION / MDM / ASSESSMENT AND PLAN / ED COURSE  I reviewed the triage vital signs and the nursing notes.                              Differential diagnosis includes, but is not limited to,  intracranial hemorrhage, meningitis/encephalitis, previous head trauma,  cavernous venous thrombosis, tension headache, temporal arteritis, migraine or migraine equivalent, idiopathic intracranial hypertension, and non-specific headache.  Patient's presentation is most consistent with acute complicated illness / injury requiring diagnostic workup.  Patient's diagnosis is consistent with postconcussive headache.  Patient presents in no acute distress, endorsing ongoing intermittent headaches since a closed head injury and syncopal episode 1 week ago.  Patient stable at this time without evidence of intractable headache, nausea, vomiting, vertigo.  She reports improvement of her symptoms after ED medication given for headache management.  We discussed the patient's ongoing symptoms and need for definitive evaluation with neurology.  She scheduled to see neurology next week as was previously scheduled.  I advised the patient to consider a headache log to monitor and document her symptoms including alleviating/aggravating factors, intensity, duration, and associated symptoms.  Patient will be discharged home with prescriptions for ibuprofen  and Reglan .  She is encouraged to take OTC Benadryl  for additional headache abortion.  Patient is to follow up with neurology as scheduled, as needed or otherwise directed. Patient is given ED precautions to return to the ED for any worsening or new symptoms.     FINAL CLINICAL IMPRESSION(S) / ED DIAGNOSES   Final diagnoses:  Post-concussion headache     Rx / DC Orders   ED Discharge Orders          Ordered    metoCLOPramide  (REGLAN ) 10 MG tablet  3 times daily with meals        02/07/24 1819    ibuprofen  (ADVIL ) 800 MG tablet  Every 8 hours PRN        02/07/24 1819             Note:  This document was prepared using Dragon voice recognition software and may include unintentional dictation errors.    Loyd Candida LULLA Aldona, PA-C 02/11/24 1829    Ernest Ronal BRAVO, MD 02/16/24 [phone]

## 2024-02-07 NOTE — ED Notes (Signed)
 See triage note Presents with family  States she conts to have headache  Has been seen twice for same  Denies any new sx's

## 2024-02-07 NOTE — Discharge Instructions (Signed)
 Your exam is overall reassuring.  No signs of any serious deficit.  No evidence of any fluid leak from the ears or nose.  Your symptoms likely represent postconcussive syndrome which can include ongoing headache, dizziness, and fatigue.  Take the prescription meds as directed, to help manage your headache symptoms.  Follow-up with [facility] neurology as scheduled next week.  Return to the ED if needed.

## 2024-06-15 ENCOUNTER — Other Ambulatory Visit: Payer: Self-pay

## 2024-06-15 ENCOUNTER — Encounter: Payer: Self-pay | Admitting: Emergency Medicine

## 2024-06-15 ENCOUNTER — Emergency Department

## 2024-06-15 ENCOUNTER — Emergency Department
Admission: EM | Admit: 2024-06-15 | Discharge: 2024-06-15 | Disposition: A | Discharge status: Home / Self Care | Attending: Emergency Medicine | Admitting: Emergency Medicine

## 2024-06-15 DIAGNOSIS — J101 Influenza due to other identified influenza virus with other respiratory manifestations: Secondary | ICD-10-CM | POA: Insufficient documentation

## 2024-06-15 HISTORY — DX: Anxiety disorder, unspecified: F41.9

## 2024-06-15 HISTORY — DX: Unspecified convulsions: R56.9

## 2024-06-15 LAB — BASIC METABOLIC PANEL WITH GFR
Anion gap: 14 (ref 5–15)
BUN: 6 mg/dL (ref 6–20)
CO2: 25 mmol/L (ref 22–32)
Calcium: 8.9 mg/dL (ref 8.9–10.3)
Chloride: 102 mmol/L (ref 98–111)
Creatinine, Ser: 0.77 mg/dL (ref 0.44–1.00)
GFR, Estimated: >60 mL/min (ref >60)
Glucose, Bld: 98 mg/dL (ref 70–99)
Potassium: 3.5 mmol/L (ref 3.5–5.1)
Sodium: 141 mmol/L (ref 135–145)

## 2024-06-15 LAB — CBC
HCT: 40.2 % (ref 36.0–46.0)
Hemoglobin: 13.2 g/dL (ref 12.0–15.0)
MCH: 29.1 pg (ref 26.0–34.0)
MCHC: 32.8 g/dL (ref 30.0–36.0)
MCV: 88.7 fL (ref 80.0–100.0)
Platelets: 216 K/uL (ref 150–400)
RBC: 4.53 MIL/uL (ref 3.87–5.11)
RDW: 12.4 % (ref 11.5–15.5)
WBC: 7.1 K/uL (ref 4.0–10.5)
nRBC: 0 % (ref 0.0–0.2)

## 2024-06-15 LAB — RESP PANEL BY RT-PCR (RSV, FLU A&B, COVID)  RVPGX2
Influenza A by PCR: POSITIVE — AB
Influenza B by PCR: NEGATIVE
Resp Syncytial Virus by PCR: NEGATIVE
SARS Coronavirus 2 by RT PCR: NEGATIVE

## 2024-06-15 LAB — TROPONIN T, HIGH SENSITIVITY: Troponin T High Sensitivity: <15 ng/L (ref 0–19)

## 2024-06-15 MED ORDER — NAPROXEN 250 MG PO TABS: 250.0000 mg | ORAL_TABLET | Freq: Two times a day (BID) | ORAL | 0 refills | Status: DC

## 2024-06-15 MED ORDER — FAMOTIDINE 20 MG PO TABS: 20.0000 mg | ORAL_TABLET | Freq: Two times a day (BID) | ORAL | 0 refills | Status: DC

## 2024-06-15 MED ORDER — ONDANSETRON 4 MG PO TBDP: 4.0000 mg | ORAL_TABLET | Freq: Three times a day (TID) | ORAL | 0 refills | Status: DC | PRN

## 2024-06-15 MED ORDER — KETOROLAC TROMETHAMINE 15 MG/ML IJ SOLN
15.0000 mg | Freq: Once | INTRAMUSCULAR | Status: AC
  Administered 2024-06-15: 15 mg via INTRAVENOUS
  Filled 2024-06-15: qty 1

## 2024-06-15 MED ORDER — PANTOPRAZOLE SODIUM 40 MG IV SOLR
40.0000 mg | Freq: Once | INTRAVENOUS | Status: AC
  Administered 2024-06-15: 40 mg via INTRAVENOUS
  Filled 2024-06-15: qty 10

## 2024-06-15 MED ORDER — ONDANSETRON HCL 4 MG/2ML IJ SOLN
4.0000 mg | Freq: Once | INTRAMUSCULAR | Status: AC
  Administered 2024-06-15: 4 mg via INTRAVENOUS
  Filled 2024-06-15: qty 2

## 2024-06-15 MED ORDER — ACETAMINOPHEN 500 MG PO TABS
1000.0000 mg | ORAL_TABLET | Freq: Once | ORAL | Status: AC
  Administered 2024-06-15: 1000 mg via ORAL
  Filled 2024-06-15: qty 2

## 2024-06-15 MED ORDER — METOCLOPRAMIDE HCL 5 MG/ML IJ SOLN
10.0000 mg | INTRAMUSCULAR | Status: AC
  Administered 2024-06-15: 10 mg via INTRAVENOUS
  Filled 2024-06-15: qty 2

## 2024-06-15 MED ORDER — SODIUM CHLORIDE 0.9 % IV BOLUS
1000.0000 mL | Freq: Once | INTRAVENOUS | Status: AC
  Administered 2024-06-15: 1000 mL via INTRAVENOUS

## 2024-06-15 NOTE — ED Provider Notes (Signed)
 [facility] Provider Note    Event Date/Time   First MD Initiated Contact with Patient 06/15/24 2117     (approximate)   History   Chief Complaint: Chest Pain   HPI  Casey Miller is a 23 y.o. female with a history of anxiety depression migraines who comes ED complaining of headache, chills, body aches, chest discomfort, nonproductive cough, nasal congestion, gradual onset and worsening for the past 2 or 3 days.  Feels fatigued.  No vomiting or diarrhea.  Has loss of appetite during this time and poor oral intake.  No syncope        Past Medical History:  Diagnosis Date   Anxiety    Depression    Migraine    Seizures [facility])     Current Outpatient Rx   Order #: [id number] Class: Normal   Order #: [id number] Class: Normal   Order #: [id number] Class: Normal   Order #: [id number] Class: Historical Med   Order #: [id number] Class: Normal   Order #: [id number] Class: Normal   Order #: [id number] Class: Normal   Order #: [id number] Class: Normal    Past Surgical History:  Procedure Laterality Date   TONSILLECTOMY     TYMPANOSTOMY TUBE PLACEMENT      Physical Exam   Triage Vital Signs: ED Triage Vitals  Encounter Vitals Group     BP 06/15/24 2038 (!) 145/103     Girls Systolic BP Percentile --      Girls Diastolic BP Percentile --      Boys Systolic BP Percentile --      Boys Diastolic BP Percentile --      Pulse Rate 06/15/24 2038 (!) 140     Resp 06/15/24 2038 (!) 24     Temp 06/15/24 2038 (!) 100.7 F (38.2 C)     Temp Source 06/15/24 2038 Oral     SpO2 06/15/24 2038 98 %     Weight 06/15/24 2039 117 lb (53.1 kg)     Height 06/15/24 2039 5' 6 (1.676 m)     Head Circumference --      Peak Flow --      Pain Score 06/15/24 2039 10     Pain Loc --      Pain Education --      Exclude from Growth Chart --     Most recent vital signs: Vitals:   06/15/24 2038 06/15/24 2321  BP: (!) 145/103 (!) 100/59  Pulse: (!) 140 99  Resp: (!) 24 17   Temp: (!) 100.7 F (38.2 C) 98.2 F (36.8 C)  SpO2: 98% 99%    General: Awake, no distress.  CV:  Good peripheral perfusion.  Tachycardia heart rate 105.  Normal distal pulses Resp:  Normal effort.  Clear lungs Abd:  No distention.  Soft nontender Other:  Dry oral mucosa   ED Results / Procedures / Treatments   Labs (all labs ordered are listed, but only abnormal results are displayed) Labs Reviewed  RESP PANEL BY RT-PCR (RSV, FLU A&B, COVID)  [id number] - Abnormal; Notable for the following components:      Result Value   Influenza A by PCR POSITIVE (*)    All other components within normal limits  BASIC METABOLIC PANEL WITH GFR  CBC  POC URINE PREG, ED  TROPONIN T, HIGH SENSITIVITY     EKG Interpreted by me Sinus tachycardia rate 130.  Rightward axis.  Normal intervals.  Normal QRS ST segments and T waves   RADIOLOGY Chest  x-ray interpreted by me, normal.  Radiology report reviewed   PROCEDURES:  Procedures   MEDICATIONS ORDERED IN ED: Medications  sodium chloride  0.9 % bolus 1,000 mL (1,000 mLs Intravenous New Bag/Given 06/15/24 2139)  ondansetron  (ZOFRAN ) injection 4 mg (4 mg Intravenous Given 06/15/24 2138)  pantoprazole  (PROTONIX ) injection 40 mg (40 mg Intravenous Given 06/15/24 2138)  ketorolac  (TORADOL ) 15 MG/ML injection 15 mg (15 mg Intravenous Given 06/15/24 2138)  acetaminophen  (TYLENOL ) tablet 1,000 mg (1,000 mg Oral Given 06/15/24 2317)  metoCLOPramide  (REGLAN ) injection 10 mg (10 mg Intravenous Given 06/15/24 2319)     IMPRESSION / MDM / ASSESSMENT AND PLAN / ED COURSE  I reviewed the triage vital signs and the nursing notes.  DDx: COVID, influenza, anemia, AKI, electrolyte derangement, myocarditis.  Doubt ACS PE dissection.  Patient's presentation is most consistent with acute presentation with potential threat to life or bodily function.     Clinical Course as of 06/15/24 2332  [place] Jun 15, 2024  2129 Presents with sore throat, nonproductive  cough, rhinorrhea, body aches.  Consistent with an influenza-like illness.  Will give Toradol , fluids while checking labs.  Doubt PTA, RPA, PE, ACS, dissection, intra-abdominal pathology.  Heart rate 105 on my exam. [PS]    Clinical Course User Index [PS] Viviann Pastor, MD    ----------------------------------------- 11:32 PM on 06/15/2024 ----------------------------------------- Flu positive.  Feeling better.  Stable for discharge   FINAL CLINICAL IMPRESSION(S) / ED DIAGNOSES   Final diagnoses:  Influenza A     Rx / DC Orders   ED Discharge Orders          Ordered    naproxen  (NAPROSYN ) 250 MG tablet  2 times daily with meals        06/15/24 2332    ondansetron  (ZOFRAN -ODT) 4 MG disintegrating tablet  Every 8 hours PRN        06/15/24 2332    famotidine  (PEPCID ) 20 MG tablet  2 times daily        06/15/24 2332             Note:  This document was prepared using Dragon voice recognition software and may include unintentional dictation errors.   Viviann Pastor, MD 06/15/24 [phone]

## 2024-06-15 NOTE — ED Notes (Signed)
 CCMD called to initiate cardiac monitoring.

## 2024-06-15 NOTE — Discharge Instructions (Addendum)
 Your influenza test is positive.  Focus on staying well-hydrated and drinking plenty of fluids as this illness runs its course.

## 2024-06-15 NOTE — ED Triage Notes (Addendum)
 Pt presents with center, stabbing chest pain x 1 day, worsening in last hour, hx of anxiety and seizures, for past 2 days headache, fatigue, low grade fever

## 2024-08-02 ENCOUNTER — Other Ambulatory Visit: Payer: Self-pay

## 2024-08-02 ENCOUNTER — Emergency Department
Admission: EM | Admit: 2024-08-02 | Discharge: 2024-08-02 | Disposition: A | Attending: Emergency Medicine | Admitting: Emergency Medicine

## 2024-08-02 ENCOUNTER — Encounter: Payer: Self-pay | Admitting: Emergency Medicine

## 2024-08-02 DIAGNOSIS — G43009 Migraine without aura, not intractable, without status migrainosus: Secondary | ICD-10-CM | POA: Insufficient documentation

## 2024-08-02 DIAGNOSIS — R519 Headache, unspecified: Secondary | ICD-10-CM | POA: Diagnosis present

## 2024-08-02 DIAGNOSIS — Z91199 Patient's noncompliance with other medical treatment and regimen due to unspecified reason: Secondary | ICD-10-CM | POA: Insufficient documentation

## 2024-08-02 LAB — COMPREHENSIVE METABOLIC PANEL WITH GFR
ALT: 13 U/L (ref 0–44)
AST: 17 U/L (ref 15–41)
Albumin: 4.5 g/dL (ref 3.5–5.0)
Alkaline Phosphatase: 52 U/L (ref 38–126)
Anion gap: 8 (ref 5–15)
BUN: 6 mg/dL (ref 6–20)
CO2: 28 mmol/L (ref 22–32)
Calcium: 9.7 mg/dL (ref 8.9–10.3)
Chloride: 106 mmol/L (ref 98–111)
Creatinine, Ser: 0.61 mg/dL (ref 0.44–1.00)
GFR, Estimated: >60 mL/min
Glucose, Bld: 95 mg/dL (ref 70–99)
Potassium: 4.1 mmol/L (ref 3.5–5.1)
Sodium: 142 mmol/L (ref 135–145)
Total Bilirubin: 0.5 mg/dL (ref 0.0–1.2)
Total Protein: 6.5 g/dL (ref 6.5–8.1)

## 2024-08-02 LAB — CBC WITH DIFFERENTIAL/PLATELET
Abs Immature Granulocytes: 0.03 10*3/uL (ref 0.00–0.07)
Basophils Absolute: 0 10*3/uL (ref 0.0–0.1)
Basophils Relative: 1 %
Eosinophils Absolute: 0.1 10*3/uL (ref 0.0–0.5)
Eosinophils Relative: 2 %
HCT: 38 % (ref 36.0–46.0)
Hemoglobin: 12.7 g/dL (ref 12.0–15.0)
Immature Granulocytes: 1 %
Lymphocytes Relative: 41 %
Lymphs Abs: 2.6 10*3/uL (ref 0.7–4.0)
MCH: 29.6 pg (ref 26.0–34.0)
MCHC: 33.4 g/dL (ref 30.0–36.0)
MCV: 88.6 fL (ref 80.0–100.0)
Monocytes Absolute: 0.4 10*3/uL (ref 0.1–1.0)
Monocytes Relative: 6 %
Neutro Abs: 3.2 10*3/uL (ref 1.7–7.7)
Neutrophils Relative %: 49 %
Platelets: 266 10*3/uL (ref 150–400)
RBC: 4.29 MIL/uL (ref 3.87–5.11)
RDW: 12.5 % (ref 11.5–15.5)
WBC: 6.4 10*3/uL (ref 4.0–10.5)
nRBC: 0 % (ref 0.0–0.2)

## 2024-08-02 LAB — HCG, QUANTITATIVE, PREGNANCY: hCG, Beta Chain, Quant, S: <1 m[IU]/mL

## 2024-08-02 MED ORDER — METOCLOPRAMIDE HCL 5 MG/ML IJ SOLN
10.0000 mg | Freq: Once | INTRAMUSCULAR | Status: AC
  Administered 2024-08-02: 10 mg via INTRAVENOUS
  Filled 2024-08-02: qty 2

## 2024-08-02 MED ORDER — SODIUM CHLORIDE 0.9 % IV BOLUS (SEPSIS)
1000.0000 mL | Freq: Once | INTRAVENOUS | Status: AC
  Administered 2024-08-02: 1000 mL via INTRAVENOUS

## 2024-08-02 MED ORDER — DIPHENHYDRAMINE HCL 50 MG/ML IJ SOLN
25.0000 mg | Freq: Once | INTRAMUSCULAR | Status: AC
  Administered 2024-08-02: 25 mg via INTRAVENOUS
  Filled 2024-08-02: qty 1

## 2024-08-02 MED ORDER — LEVETIRACETAM (KEPPRA) 500 MG/5 ML ADULT IV PUSH
1000.0000 mg | Freq: Once | INTRAVENOUS | Status: AC
  Administered 2024-08-02: 1000 mg via INTRAVENOUS
  Filled 2024-08-02: qty 10

## 2024-08-02 MED ORDER — KETOROLAC TROMETHAMINE 30 MG/ML IJ SOLN
30.0000 mg | Freq: Once | INTRAMUSCULAR | Status: AC
  Administered 2024-08-02: 30 mg via INTRAVENOUS
  Filled 2024-08-02: qty 1

## 2024-08-02 NOTE — ED Notes (Signed)
 Pt reporting to ED d/t headache and seizure like activity at home. Pt began to feel twitching, which is an aura that normally precedes the pts actual seizures. Pt took seizure medications after feeling aura. Pt now currently resting in ED stretcher and only complaining of headache now at this time, endorsing blurred vision. Pt aox4. GCS 15. Pt ABCs intact. RR even and unlabored. Pt in NAD. Bed in lowest locked position. Call bell in reach.   Past Medical History:  Diagnosis Date   Anxiety    Depression    Migraine    Seizures (HCC)

## 2024-08-02 NOTE — Discharge Instructions (Signed)
 Please continue your seizure medications as prescribed and follow-up with Dr. Lane.   You may alternate over the counter Tylenol  1000 mg every 6 hours as needed for pain, fever and Ibuprofen  800 mg every 6-8 hours as needed for pain, fever.  Please take Ibuprofen  with food.  Do not take more than 4000 mg of Tylenol  (acetaminophen ) in a 24 hour period.  Your lab work today was reassuring.

## 2024-08-02 NOTE — ED Notes (Signed)
 Pt discharged to ED circle at this time and left with all belongings. Pt ABCs intact. RR even and unlabored. Pt in NAD. Pt denies further needs from this RN.

## 2024-08-02 NOTE — ED Triage Notes (Signed)
 Pt in with mother, states she has had a R temporal migraine x 2 days, +nausea and +photophobia reported. Pt states she has hx of seizures, and has seizure meds she is to take daily, but only takes them when she feels a seizure coming on. States she felt herself twitching last night, and took a seizure med that starts with T. Mom states pt has been acting drowsy and slow all day, and her pupils are more dilated than normal.

## 2024-08-02 NOTE — ED Provider Notes (Signed)
 "  [facility] Provider Note    Event Date/Time   First MD Initiated Contact with Patient 08/02/24 [phone]     (approximate)   History   Headache   HPI  Casey Miller is a 24 y.o. female with history of anxiety, depression, migraines, syncope vs. seizures who presents to the emergency department with her mother for concerns of left-sided headache not improving with over-the-counter medications.  Feels similar to her previous headaches.  Has photophobia, phonophobia and nausea but no vomiting.  No head injury.  Not on blood thinners.  Reports feeling weak all over but no focal numbness, tingling or weakness.  Has previously been here and received Toradol , Reglan  and Benadryl  with good relief.  Mother is also concerned that she is at risk for having a seizure.  She reports that she has been noncompliant with her seizure medications.  She is not sure the name of her medications but mother states she thinks it starts with a T.  It looks like her last visit with Dr. Lane in October 2025 she was started on the Lamictal taper.  Patient is not sure if this is what she is taking.  She is not able to tell me when she last took Lamictal.  She states she does have the prescription at home and does not need a refill.   History provided by patient and mother.    Past Medical History:  Diagnosis Date   Anxiety    Depression    Migraine    Seizures (HCC)     Past Surgical History:  Procedure Laterality Date   TONSILLECTOMY     TYMPANOSTOMY TUBE PLACEMENT      MEDICATIONS:  Prior to Admission medications  Medication Sig Start Date End Date Taking? Authorizing Provider  acetaminophen  (TYLENOL ) 325 MG tablet Take 650 mg by mouth every 6 (six) hours as needed.    [provider]  albuterol  (VENTOLIN  HFA) 108 (90 Base) MCG/ACT inhaler Inhale 1-2 puffs into the lungs every 6 (six) hours as needed. 12/14/22   Corlis Burnard DEL, NP  albuterol  (VENTOLIN  HFA) 108 (90  Base) MCG/ACT inhaler Inhale 2 puffs into the lungs every 6 (six) hours as needed for wheezing or shortness of breath. 12/29/22   Lang Dover, MD  famotidine  (PEPCID ) 20 MG tablet Take 1 tablet (20 mg total) by mouth 2 (two) times daily. 06/15/24   Viviann Pastor, MD  ibuprofen  (ADVIL ) 800 MG tablet Take 1 tablet (800 mg total) by mouth every 8 (eight) hours as needed. 02/07/24   Menshew, Candida LULLA Kings, PA-C  metoCLOPramide  (REGLAN ) 10 MG tablet Take 1 tablet (10 mg total) by mouth 3 (three) times daily with meals for 10 days. 02/07/24 02/17/24  Menshew, Candida LULLA Kings, PA-C  naproxen  (NAPROSYN ) 250 MG tablet Take 1 tablet (250 mg total) by mouth 2 (two) times daily with a meal. 06/15/24   Viviann Pastor, MD  ondansetron  (ZOFRAN -ODT) 4 MG disintegrating tablet Take 1 tablet (4 mg total) by mouth every 8 (eight) hours as needed for nausea or vomiting. 06/15/24   Viviann Pastor, MD  mirtazapine (REMERON) 7.5 MG tablet Take 7.5 mg by mouth at bedtime. Patient not taking: No sig reported 04/19/20 11/28/20  [provider]    Physical Exam   Triage Vital Signs: ED Triage Vitals  Encounter Vitals Group     BP 08/02/24 0006 114/78     Girls Systolic BP Percentile --      Girls Diastolic  BP Percentile --      Boys Systolic BP Percentile --      Boys Diastolic BP Percentile --      Pulse Rate 08/02/24 0006 89     Resp 08/02/24 0006 17     Temp 08/02/24 0006 98.3 F (36.8 C)     Temp Source 08/02/24 0006 Oral     SpO2 08/02/24 0006 97 %     Weight 08/02/24 0006 120 lb (54.4 kg)     Height 08/02/24 0006 5' 6 (1.676 m)     Head Circumference --      Peak Flow --      Pain Score 08/02/24 0025 6     Pain Loc --      Pain Education --      Exclude from Growth Chart --     Most recent vital signs: Vitals:   08/02/24 0006  BP: 114/78  Pulse: 89  Resp: 17  Temp: 98.3 F (36.8 C)  SpO2: 97%    CONSTITUTIONAL: Alert, responds appropriately to questions.  Withdrawn,  appears uncomfortable but afebrile and nontoxic HEAD: Normocephalic, atraumatic EYES: Conjunctivae clear, pupils appear equal, sclera nonicteric ENT: normal nose; moist mucous membranes NECK: Supple, normal ROM CARD: RRR; S1 and S2 appreciated RESP: Normal chest excursion without splinting or tachypnea; breath sounds clear and equal bilaterally; no wheezes, no rhonchi, no rales, no hypoxia or respiratory distress, speaking full sentences ABD/GI: Non-distended; soft, non-tender, no rebound, no guarding, no peritoneal signs BACK: The back appears normal EXT: Normal ROM in all joints; no deformity noted, no edema SKIN: Normal color for age and race; warm; no rash on exposed skin NEURO: Moves all extremities equally, normal speech, normal sensation diffusely, normal gait PSYCH: The patient's mood and manner are appropriate.   ED Results / Procedures / Treatments   LABS: (all labs ordered are listed, but only abnormal results are displayed) Labs Reviewed  CBC WITH DIFFERENTIAL/PLATELET  COMPREHENSIVE METABOLIC PANEL WITH GFR  HCG, QUANTITATIVE, PREGNANCY  LAMOTRIGINE LEVEL     EKG:  EKG Interpretation Date/Time:    Ventricular Rate:    PR Interval:    QRS Duration:    QT Interval:    QTC Calculation:   R Axis:      Text Interpretation:           RADIOLOGY: My personal review and interpretation of imaging:    I have personally reviewed all radiology reports.   No results found.   PROCEDURES:  Critical Care performed: No    Procedures    IMPRESSION / MDM / ASSESSMENT AND PLAN / ED COURSE  I reviewed the triage vital signs and the nursing notes.    Patient here with migraine headache and concern for possible seizure due to medical noncompliance.     DIFFERENTIAL DIAGNOSIS (includes but not limited to):   Migraine, doubt intracranial hemorrhage, meningitis, encephalitis, stroke, cavernous sinus thrombosis   Patient's presentation is most consistent  with acute presentation with potential threat to life or bodily function.   PLAN: Will give migraine cocktail.  Mother is concerned that she may have a seizure.  She is unable to tell me what seizure medication she takes.  When asked if it is Lamictal or lamotrigine they are not sure.  They state that they think that the medication starts with the T.  Will load her with IV Keppra  but have encouraged her to follow-up with her neurologist to figure out what it is she should be  taking.  She states that she has prescription medications at home.  Will check basic blood work today to ensure no anemia, electrolyte derangement, pregnancy.  No indication for emergent head imaging.  Patient mother comfortable with this plan.   MEDICATIONS GIVEN IN ED: Medications  sodium chloride  0.9 % bolus 1,000 mL (1,000 mLs Intravenous New Bag/Given 08/02/24 0159)  ketorolac  (TORADOL ) 30 MG/ML injection 30 mg (30 mg Intravenous Given 08/02/24 0145)  metoCLOPramide  (REGLAN ) injection 10 mg (10 mg Intravenous Given 08/02/24 0144)  diphenhydrAMINE  (BENADRYL ) injection 25 mg (25 mg Intravenous Given 08/02/24 0150)  levETIRAcetam  (KEPPRA ) undiluted injection 1,000 mg (1,000 mg Intravenous Given 08/02/24 0145)     ED COURSE: Labs show normal hemoglobin, electrolytes.  Pregnancy test negative.  No witnessed seizure-like activity here.  Patient continues to be neuro intact.  Headache now gone after migraine cocktail.  Will discharge with neurology follow-up.  At this time, I do not feel there is any life-threatening condition present. I reviewed all nursing notes, vitals, pertinent previous records.  All lab and urine results, EKGs, imaging ordered have been independently reviewed and interpreted by myself.  I reviewed all available radiology reports from any imaging ordered this visit.  Based on my assessment, I feel the patient is safe to be discharged home without further emergent workup and can continue workup as an  outpatient as needed. Discussed all findings, treatment plan as well as usual and customary return precautions.  They verbalize understanding and are comfortable with this plan.  Outpatient follow-up has been provided as needed.  All questions have been answered.    CONSULTS:  none   OUTSIDE RECORDS REVIEWED: Reviewed previous neurology and cardiology notes.       FINAL CLINICAL IMPRESSION(S) / ED DIAGNOSES   Final diagnoses:  Migraine without aura and without status migrainosus, not intractable  Medically noncompliant     Rx / DC Orders   ED Discharge Orders     None        Note:  This document was prepared using Dragon voice recognition software and may include unintentional dictation errors.   Caydin Yeatts, Josette SAILOR, DO 08/02/24 0330  "

## 2024-08-05 LAB — LAMOTRIGINE LEVEL: Lamotrigine Lvl: <1 ug/mL — ABNORMAL LOW (ref 2.0–20.0)

## 2024-08-14 ENCOUNTER — Encounter: Payer: Self-pay | Admitting: Emergency Medicine

## 2024-08-14 ENCOUNTER — Ambulatory Visit
Admission: EM | Admit: 2024-08-14 | Discharge: 2024-08-14 | Disposition: A | Discharge status: Home / Self Care | Source: Home / Self Care

## 2024-08-14 DIAGNOSIS — N898 Other specified noninflammatory disorders of vagina: Secondary | ICD-10-CM

## 2024-08-14 LAB — POCT URINE DIPSTICK
Bilirubin, UA: NEGATIVE
Glucose, UA: NEGATIVE mg/dL
Ketones, POC UA: NEGATIVE mg/dL
Leukocytes, UA: NEGATIVE
Nitrite, UA: NEGATIVE
Protein Ur, POC: NEGATIVE mg/dL
Spec Grav, UA: >=1.03 — AB
Urobilinogen, UA: 2 U/dL — AB
pH, UA: 6.5

## 2024-08-14 LAB — POCT URINE PREGNANCY: Preg Test, Ur: NEGATIVE

## 2024-08-14 MED ORDER — METRONIDAZOLE 500 MG PO TABS: 500.0000 mg | ORAL_TABLET | Freq: Two times a day (BID) | ORAL | 0 refills | Status: AC

## 2024-08-14 NOTE — ED Provider Notes (Signed)
 " Casey Miller    CSN: [id number] Arrival date & time: 08/14/24  1459      History   Chief Complaint No chief complaint on file.   HPI Casey Miller is a 24 y.o. female.   Patient presents for evaluation of an increase in vaginal discharge and lower abdominal cramping beginning 1 to 2 weeks ago after unprotected encounter with new partner.  Associated urinary frequency without dysuria hematuria flank pain or fever.  History of BV.  Has not attempted treatment.  Was using Depo-Provera  for birth control causing amenorrhea, missed dosage for December 2025.     Past Medical History:  Diagnosis Date   Anxiety    Depression    Migraine    Seizures (HCC)     Patient Active Problem List   Diagnosis Date Noted   Allergic rhinitis 05/14/2010    Past Surgical History:  Procedure Laterality Date   TONSILLECTOMY     TYMPANOSTOMY TUBE PLACEMENT      OB History     Gravida  0   Para  0   Term  0   Preterm  0   AB  0   Living  0      SAB  0   IAB  0   Ectopic  0   Multiple  0   Live Births  0            Home Medications    Prior to Admission medications  Medication Sig Start Date End Date Taking? Authorizing Provider  acetaminophen  (TYLENOL ) 325 MG tablet Take 650 mg by mouth every 6 (six) hours as needed.    [provider]  albuterol  (VENTOLIN  HFA) 108 (90 Base) MCG/ACT inhaler Inhale 1-2 puffs into the lungs every 6 (six) hours as needed. 12/14/22   Corlis Burnard DEL, NP  albuterol  (VENTOLIN  HFA) 108 (90 Base) MCG/ACT inhaler Inhale 2 puffs into the lungs every 6 (six) hours as needed for wheezing or shortness of breath. 12/29/22   Lang Dover, MD  famotidine  (PEPCID ) 20 MG tablet Take 1 tablet (20 mg total) by mouth 2 (two) times daily. 06/15/24   Viviann Pastor, MD  ibuprofen  (ADVIL ) 800 MG tablet Take 1 tablet (800 mg total) by mouth every 8 (eight) hours as needed. 02/07/24   Menshew, Candida LULLA Kings, PA-C  metoCLOPramide   (REGLAN ) 10 MG tablet Take 1 tablet (10 mg total) by mouth 3 (three) times daily with meals for 10 days. 02/07/24 02/17/24  Menshew, Candida LULLA Kings, PA-C  naproxen  (NAPROSYN ) 250 MG tablet Take 1 tablet (250 mg total) by mouth 2 (two) times daily with a meal. 06/15/24   Viviann Pastor, MD  ondansetron  (ZOFRAN -ODT) 4 MG disintegrating tablet Take 1 tablet (4 mg total) by mouth every 8 (eight) hours as needed for nausea or vomiting. 06/15/24   Viviann Pastor, MD  mirtazapine (REMERON) 7.5 MG tablet Take 7.5 mg by mouth at bedtime. Patient not taking: No sig reported 04/19/20 11/28/20  [provider]    Family History Family History  Problem Relation Age of Onset   Cancer Paternal Grandmother    Cancer Maternal Grandmother    Hypertension Father    Migraines Father    Cancer Father    Migraines Mother    Mental illness Sister     Social History Social History[1]   Allergies   Amoxicillin   Review of Systems Review of Systems   Physical Exam Triage Vital Signs ED Triage Vitals  Encounter Vitals Group     BP      Girls Systolic BP Percentile      Girls Diastolic BP Percentile      Boys Systolic BP Percentile      Boys Diastolic BP Percentile      Pulse      Resp      Temp      Temp src      SpO2      Weight      Height      Head Circumference      Peak Flow      Pain Score      Pain Loc      Pain Education      Exclude from Growth Chart    No data found.  Updated Vital Signs LMP  (LMP Unknown)   Visual Acuity Right Eye Distance:   Left Eye Distance:   Bilateral Distance:    Right Eye Near:   Left Eye Near:    Bilateral Near:     Physical Exam Constitutional:      Appearance: Normal appearance.  Eyes:     Extraocular Movements: Extraocular movements intact.  Pulmonary:     Effort: Pulmonary effort is normal.  Abdominal:     Tenderness: There is no abdominal tenderness. There is no right CVA tenderness, left CVA tenderness or guarding.   Genitourinary:    Comments: deferred Neurological:     Mental Status: She is alert and oriented to person, place, and time.      UC Treatments / Results  Labs (all labs ordered are listed, but only abnormal results are displayed) Labs Reviewed - No data to display  EKG   Radiology No results found.  Procedures Procedures (including critical care time)  Medications Ordered in UC Medications - No data to display  Initial Impression / Assessment and Plan / UC Course  I have reviewed the triage vital signs and the nursing notes.  Pertinent labs & imaging results that were available during my care of the patient were reviewed by me and considered in my medical decision making (see chart for details).  Vaginal discharge  Urinalysis negative, urine pregnancy negative, discussed findings with patient empirically treating for bacterial vaginosis, metronidazole  prescribed and advised abstaining from alcohol during treatment, STI labs pending will treat per protocol, advised abstinence until lab results, and/or treatment is complete, advised condom use during all sexual encounters moving, may follow-up with urgent care as needed  Final Clinical Impressions(s) / UC Diagnoses   Final diagnoses:  None   Discharge Instructions   None    ED Prescriptions   None    PDMP not reviewed this encounter.     [1]  Social History Tobacco Use   Smoking status: Never    Passive exposure: Yes   Smokeless tobacco: Never   Tobacco comments:    mother smokes outside  Vaping Use   Vaping status: Some Days  Substance Use Topics   Alcohol use: No   Drug use: No     Teresa Shelba SAUNDERS, NP 08/14/24 1647  "

## 2024-08-14 NOTE — Discharge Instructions (Signed)
 Today you are being treated prophylactically for  Bacterial vaginosis   Urinalysis is negative for bladder infection and urine pregnancy test is negative  Take Metronidazole  500 mg twice a day for 7 days, do not drink alcohol while using medication, this will make you feel sick   Bacterial vaginosis which results from an overgrowth of one on several organisms that are normally present in your vagina. Vaginosis is an inflammation of the vagina that can result in discharge, itching and pain.  Labs pending 2-3 days, you will be contacted if positive for any sti and treatment will be sent to the pharmacy, you will have to return to the clinic if positive for gonorrhea to receive treatment   Please refrain from having sex until labs results, if positive please refrain from having sex until treatment complete and symptoms resolve   If positive for HIV, Syphilis, Chlamydia  gonorrhea or trichomoniasis please notify partner or partners so they may tested as well  t is recommended you use some form of protection against the transmission of sti infections  such as condoms or dental dams with each sexual encounter     In addition: Avoid baths, hot tubs and whirlpool spas.  Don't use scented or harsh soaps Avoid irritants. These include scented tampons and pads. Wipe from front to back after using the toilet. Don't douche. Your vagina doesn't require cleansing other than normal bathing.  Use a condom.  Wear cotton underwear, this fabric absorbs some moisture.

## 2024-08-15 LAB — CERVICOVAGINAL ANCILLARY ONLY
Bacterial Vaginitis (gardnerella): POSITIVE — AB
Candida Glabrata: POSITIVE — AB
Candida Vaginitis: NEGATIVE
Chlamydia: NEGATIVE
Comment: NEGATIVE
Comment: NEGATIVE
Comment: NEGATIVE
Comment: NEGATIVE
Comment: NEGATIVE
Comment: NORMAL
Neisseria Gonorrhea: NEGATIVE
Trichomonas: NEGATIVE

## 2024-08-15 LAB — SYPHILIS: RPR W/REFLEX TO RPR TITER AND TREPONEMAL ANTIBODIES, TRADITIONAL SCREENING AND DIAGNOSIS ALGORITHM: RPR Ser Ql: NONREACTIVE

## 2024-08-15 LAB — HIV ANTIBODY (ROUTINE TESTING W REFLEX): HIV Screen 4th Generation wRfx: NONREACTIVE
